# Patient Record
Sex: Male | Born: 1985 | Race: White | Hispanic: No | Marital: Married | Smoking: Never smoker
Health system: Southern US, Community
[De-identification: ages and names within clinical notes are randomized; demographics above are authoritative.]

## PROBLEM LIST (undated history)

## (undated) DIAGNOSIS — F32A Depression, unspecified: Secondary | ICD-10-CM

## (undated) DIAGNOSIS — F419 Anxiety disorder, unspecified: Secondary | ICD-10-CM

## (undated) DIAGNOSIS — J45909 Unspecified asthma, uncomplicated: Secondary | ICD-10-CM

## (undated) DIAGNOSIS — J984 Other disorders of lung: Secondary | ICD-10-CM

## (undated) DIAGNOSIS — T7840XA Allergy, unspecified, initial encounter: Secondary | ICD-10-CM

## (undated) DIAGNOSIS — G43909 Migraine, unspecified, not intractable, without status migrainosus: Secondary | ICD-10-CM

## (undated) DIAGNOSIS — S060X9A Concussion with loss of consciousness of unspecified duration, initial encounter: Secondary | ICD-10-CM

## (undated) DIAGNOSIS — S060XAA Concussion with loss of consciousness status unknown, initial encounter: Secondary | ICD-10-CM

## (undated) DIAGNOSIS — F329 Major depressive disorder, single episode, unspecified: Secondary | ICD-10-CM

## (undated) DIAGNOSIS — R7303 Prediabetes: Secondary | ICD-10-CM

## (undated) HISTORY — DX: Anxiety disorder, unspecified: F41.9

## (undated) HISTORY — DX: Concussion with loss of consciousness of unspecified duration, initial encounter: S06.0X9A

## (undated) HISTORY — PX: ADENOIDECTOMY: SUR15

## (undated) HISTORY — PX: TONSILLECTOMY: SUR1361

## (undated) HISTORY — DX: Concussion with loss of consciousness status unknown, initial encounter: S06.0XAA

## (undated) HISTORY — DX: Allergy, unspecified, initial encounter: T78.40XA

## (undated) HISTORY — DX: Unspecified asthma, uncomplicated: J45.909

## (undated) HISTORY — DX: Other disorders of lung: J98.4

## (undated) HISTORY — DX: Prediabetes: R73.03

## (undated) HISTORY — DX: Migraine, unspecified, not intractable, without status migrainosus: G43.909

## (undated) HISTORY — PX: APPENDECTOMY: SHX54

## (undated) HISTORY — DX: Depression, unspecified: F32.A

---
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
## 2005-07-18 DIAGNOSIS — L259 Unspecified contact dermatitis, unspecified cause: Secondary | ICD-10-CM | POA: Insufficient documentation

## 2006-02-07 DIAGNOSIS — Z0000 Encounter for general adult medical examination without abnormal findings: Secondary | ICD-10-CM | POA: Insufficient documentation

## 2007-02-03 DIAGNOSIS — M542 Cervicalgia: Secondary | ICD-10-CM | POA: Insufficient documentation

## 2008-02-05 DIAGNOSIS — R0683 Snoring: Secondary | ICD-10-CM | POA: Insufficient documentation

## 2015-11-11 ENCOUNTER — Ambulatory Visit (INDEPENDENT_AMBULATORY_CARE_PROVIDER_SITE_OTHER): Payer: BC Managed Care – PPO

## 2015-11-11 ENCOUNTER — Ambulatory Visit (INDEPENDENT_AMBULATORY_CARE_PROVIDER_SITE_OTHER): Payer: BC Managed Care – PPO | Admitting: Family Medicine

## 2015-11-11 VITALS — BP 128/82 | HR 103 | Temp 98.6°F | Resp 18 | Ht 70.0 in | Wt 253.2 lb

## 2015-11-11 DIAGNOSIS — R0602 Shortness of breath: Secondary | ICD-10-CM | POA: Diagnosis not present

## 2015-11-11 DIAGNOSIS — Z131 Encounter for screening for diabetes mellitus: Secondary | ICD-10-CM

## 2015-11-11 DIAGNOSIS — J189 Pneumonia, unspecified organism: Secondary | ICD-10-CM | POA: Diagnosis not present

## 2015-11-11 DIAGNOSIS — R05 Cough: Secondary | ICD-10-CM | POA: Diagnosis not present

## 2015-11-11 DIAGNOSIS — J9801 Acute bronchospasm: Secondary | ICD-10-CM | POA: Diagnosis not present

## 2015-11-11 DIAGNOSIS — J101 Influenza due to other identified influenza virus with other respiratory manifestations: Secondary | ICD-10-CM | POA: Diagnosis not present

## 2015-11-11 LAB — POCT CBC
Granulocyte percent: 71 % (ref 37–80)
HCT, POC: 40.4 % — AB (ref 43.5–53.7)
Hemoglobin: 14.6 g/dL (ref 14.1–18.1)
Lymph, poc: 2.6 (ref 0.6–3.4)
MCH, POC: 29.5 pg (ref 27–31.2)
MCHC: 36.3 g/dL — AB (ref 31.8–35.4)
MCV: 81.2 fL (ref 80–97)
MID (cbc): 0.7 (ref 0–0.9)
MPV: 6 fL (ref 0–99.8)
POC Granulocyte: 8.1 — AB (ref 2–6.9)
POC LYMPH PERCENT: 22.5 % (ref 10–50)
POC MID %: 6.5 % (ref 0–12)
Platelet Count, POC: 325 10*3/uL (ref 142–424)
RBC: 4.97 M/uL (ref 4.69–6.13)
RDW, POC: 12.6 %
WBC: 11.4 10*3/uL — AB (ref 4.6–10.2)

## 2015-11-11 LAB — POCT INFLUENZA A/B
Influenza A, POC: NEGATIVE
Influenza B, POC: NEGATIVE

## 2015-11-11 LAB — GLUCOSE, POCT (MANUAL RESULT ENTRY): POC Glucose: 103 mg/dL — AB (ref 70–99)

## 2015-11-11 MED ORDER — ALBUTEROL SULFATE (2.5 MG/3ML) 0.083% IN NEBU
2.5000 mg | INHALATION_SOLUTION | Freq: Once | RESPIRATORY_TRACT | Status: AC
  Administered 2015-11-11: 2.5 mg via RESPIRATORY_TRACT

## 2015-11-11 MED ORDER — PREDNISONE 20 MG PO TABS: ORAL_TABLET | ORAL | Status: DC

## 2015-11-11 MED ORDER — HYDROCOD POLST-CPM POLST ER 10-8 MG/5ML PO SUER: 5.0000 mL | Freq: Two times a day (BID) | ORAL | Status: DC | PRN

## 2015-11-11 MED ORDER — DOXYCYCLINE HYCLATE 100 MG PO CAPS: 100.0000 mg | ORAL_CAPSULE | Freq: Two times a day (BID) | ORAL | Status: DC

## 2015-11-11 MED ORDER — ALBUTEROL SULFATE HFA 108 (90 BASE) MCG/ACT IN AERS: 2.0000 | INHALATION_SPRAY | Freq: Four times a day (QID) | RESPIRATORY_TRACT | Status: DC | PRN

## 2015-11-11 MED ORDER — IPRATROPIUM BROMIDE 0.02 % IN SOLN
0.5000 mg | Freq: Once | RESPIRATORY_TRACT | Status: AC
  Administered 2015-11-11: 0.5 mg via RESPIRATORY_TRACT

## 2015-11-11 NOTE — Progress Notes (Signed)
Subjective:    Patient ID: Jonathon Murphy, male    DOB: September 22, 1985, 30 y.o.   MRN: [id number]  11/11/2015  Laryngitis; Sore Throat; Cough; and Shortness of Breath   HPI This 30 y.o. male presents for evaluation of persistent flu symptoms.  Prescribed Tamiflu recently for influenza; March 9,2017; completed on 11/08/15.  +possible fever in past 72 hours. Onset of illness on 11/01/15.  +chills/sweats. No body aches in last two days.  +HA.  +ear pain B; +ST moderate ten days ago. +Rhinorrhea green yellow bloody.  +hoarseness one week ago.  +horrible cough; +SOB with flu; improved temporarily and now has worsened; black mold in room at [employer] in dorm room.  +wheezing.  +post-tussive emesis; +diarrhea.  +Tamiflu, Mucinex DM, nasal spray.  Seen at Urgent Care in [place].  No tobacco.  +childhood asthma; +allergies with history of allergy shots.  Did not get flu vaccine this year.   PCP: moved to [place] three months ago; from [place].     Review of Systems  Constitutional: Positive for fever, chills, diaphoresis and fatigue. Negative for activity change and appetite change.  HENT: Positive for congestion, ear pain, postnasal drip, rhinorrhea, sinus pressure, sore throat and voice change. Negative for trouble swallowing.   Eyes: Negative for visual disturbance.  Respiratory: Positive for cough, shortness of breath and wheezing.   Cardiovascular: Negative for chest pain, palpitations and leg swelling.  Gastrointestinal: Positive for diarrhea. Negative for nausea, vomiting and abdominal pain.  Endocrine: Negative for cold intolerance, heat intolerance, polydipsia, polyphagia and polyuria.  Neurological: Positive for headaches. Negative for dizziness, tremors, seizures, syncope, facial asymmetry, speech difficulty, weakness, light-headedness and numbness.    History reviewed. No pertinent past medical history. History reviewed. No pertinent past surgical history. Allergies  Allergen Reactions  . Bee  Venom Anaphylaxis  . Sulfa Antibiotics Shortness Of Breath  . Benadryl [Diphenhydramine] Hives  . Erythromycin Other (See Comments)    Does not know  . Suprax [Cefixime] Other (See Comments)    Does not know      Social History   Social History  . Marital Status: Single    Spouse Name: N/A  . Number of Children: N/A  . Years of Education: N/A   Occupational History  . Not on file.   Social History Main Topics  . Smoking status: Never Smoker   . Smokeless tobacco: Not on file  . Alcohol Use: Not on file  . Drug Use: Not on file  . Sexual Activity: Not on file   Other Topics Concern  . Not on file   Social History Narrative  . No narrative on file   History reviewed. No pertinent family history.     Objective:    BP 128/82 mmHg  Pulse 103  Temp(Src) 98.6 F (37 C) (Oral)  Resp 18  Ht  (1.778 m)  Wt 253 lb 3.2 oz (114.851 kg)  BMI 36.33 kg/m2  SpO2 99% Physical Exam  Constitutional: He is oriented to person, place, and time. He appears well-developed and well-nourished. No distress.  obese  HENT:  Head: Normocephalic and atraumatic.  Right Ear: Tympanic membrane, external ear and ear canal normal.  Left Ear: Tympanic membrane, external ear and ear canal normal.  Nose: Mucosal edema and rhinorrhea present. Right sinus exhibits maxillary sinus tenderness. Right sinus exhibits no frontal sinus tenderness. Left sinus exhibits maxillary sinus tenderness. Left sinus exhibits no frontal sinus tenderness.  Mouth/Throat: Oropharynx is clear and moist. No oropharyngeal exudate.  Eyes: Conjunctivae and EOM are normal. Pupils are equal, round, and reactive to light.  Neck: Normal range of motion. Neck supple. Carotid bruit is not present. No thyromegaly present.  Cardiovascular: Normal rate, regular rhythm, normal heart sounds and intact distal pulses.  Exam reveals no gallop and no friction rub.   No murmur heard. Pulmonary/Chest: Effort normal and breath sounds  normal. No accessory muscle usage. Tachypnea noted. He has no decreased breath sounds. He has no wheezes. He has no rales.  RR 22.  Harsh hacking cough throughout exam.  Musculoskeletal: He exhibits no edema.  Lymphadenopathy:    He has cervical adenopathy.  Neurological: He is alert and oriented to person, place, and time. No cranial nerve deficit.  Skin: Skin is warm and dry. No rash noted. He is not diaphoretic.  Psychiatric: He has a normal mood and affect. His behavior is normal.  Nursing note and vitals reviewed.  Results for orders placed or performed in visit on 11/11/15  POCT CBC  Result Value Ref Range   WBC 11.4 (A) 4.6 - 10.2 K/uL   Lymph, poc 2.6 0.6 - 3.4   POC LYMPH PERCENT 22.5 10 - 50 %L   MID (cbc) 0.7 0 - 0.9   POC MID % 6.5 0 - 12 %M   POC Granulocyte 8.1 (A) 2 - 6.9   Granulocyte percent 71.0 37 - 80 %G   RBC 4.97 4.69 - 6.13 M/uL   Hemoglobin 14.6 14.1 - 18.1 g/dL   HCT, POC 16.1 (A) 09.6 - 53.7 %   MCV 81.2 80 - 97 fL   MCH, POC 29.5 27 - 31.2 pg   MCHC 36.3 (A) 31.8 - 35.4 g/dL   RDW, POC 04.5 %   Platelet Count, POC 325 142 - 424 K/uL   MPV 6.0 0 - 99.8 fL  POCT glucose (manual entry)  Result Value Ref Range   POC Glucose 103 (A) 70 - 99 mg/dl  POCT Influenza A/B  Result Value Ref Range   Influenza A, POC Negative Negative   Influenza B, POC Negative Negative       Assessment & Plan:   1. Influenza A   2. Bronchospasm   3. Screening for diabetes mellitus   4. CAP (community acquired pneumonia)     Orders Placed This Encounter  Procedures  . DG Chest 2 View    Standing Status: Future     Number of Occurrences: 1     Standing Expiration Date: 11/10/2016    Order Specific Question:  Reason for Exam (SYMPTOM  OR DIAGNOSIS REQUIRED)    Answer:  influenza A positive ten days ago; now with SOB, wheezing    Order Specific Question:  Preferred imaging location?    Answer:  External  . POCT CBC  . POCT glucose (manual entry)  . POCT Influenza  A/B   Meds ordered this encounter  Medications  . guaiFENesin (MUCINEX) 600 MG 12 hr tablet    Sig: Take by mouth 2 (two) times daily.  . Pseudoeph-Doxylamine-DM-APAP (NYQUIL PO)    Sig: Take by mouth.  Marland Kitchen albuterol (PROVENTIL) (2.5 MG/3ML) 0.083% nebulizer solution 2.5 mg    Sig:   . ipratropium (ATROVENT) nebulizer solution 0.5 mg    Sig:   . doxycycline (VIBRAMYCIN) 100 MG capsule    Sig: Take 1 capsule (100 mg total) by mouth 2 (two) times daily.    Dispense:  20 capsule    Refill:  0  . predniSONE (DELTASONE)  20 MG tablet    Sig: Three tablets daily x 2 days then two tablets daily x 5 days then one tablet daily x 5 days    Dispense:  21 tablet    Refill:  0  . chlorpheniramine-HYDROcodone (TUSSIONEX PENNKINETIC ER) 10-8 MG/5ML SUER    Sig: Take 5 mLs by mouth every 12 (twelve) hours as needed for cough.    Dispense:  180 mL    Refill:  0  . albuterol (PROVENTIL HFA;VENTOLIN HFA) 108 (90 Base) MCG/ACT inhaler    Sig: Inhale 2 puffs into the lungs every 6 (six) hours as needed for wheezing or shortness of breath (cough, shortness of breath or wheezing.).    Dispense:  1 Inhaler    Refill:  1    No Follow-up on file.    Dequann Vandervelden Paulita FujitaMartin Legend Tumminello, M.D. [facility]  [place] [address] [place], [place]  [id number] [phone] phone [phone] fax

## 2015-11-11 NOTE — Patient Instructions (Addendum)
   IF you received an x-ray today, you will receive an invoice from [facility]. Please contact  Radiology at [phone] with questions or concerns regarding your invoice.   IF you received labwork today, you will receive an invoice from [facility]. Please contact [facility] at [phone] with questions or concerns regarding your invoice.   Our billing staff will not be able to assist you with questions regarding bills from these companies.  You will be contacted with the lab results as soon as they are available. The fastest way to get your results is to activate your My Chart account. Instructions are located on the last page of this paperwork. If you have not heard from us regarding the results in 2 weeks, please contact this office.      Community-Acquired Pneumonia, Adult Pneumonia is an infection of the lungs. There are different types of pneumonia. One type can develop while a person is in a hospital. A different type, called community-acquired pneumonia, develops in people who are not, or have not recently been, in the hospital or other health care facility.  CAUSES Pneumonia may be caused by bacteria, viruses, or funguses. Community-acquired pneumonia is often caused by Streptococcus pneumonia bacteria. These bacteria are often passed from one person to another by breathing in droplets from the cough or sneeze of an infected person. RISK FACTORS The condition is more likely to develop in:  People who havechronic diseases, such as chronic obstructive pulmonary disease (COPD), asthma, congestive heart failure, cystic fibrosis, diabetes, or kidney disease.  People who haveearly-stage or late-stage HIV.  People who havesickle cell disease.  People who havehad their spleen removed (splenectomy).  People who havepoor dental hygiene.  People who havemedical conditions that increase the risk of breathing in (aspirating)  secretions their own mouth and nose.   People who havea weakened immune system (immunocompromised).  People who smoke.  People whotravel to areas where pneumonia-causing germs commonly exist.  People whoare around animal habitats or animals that have pneumonia-causing germs, including birds, bats, rabbits, cats, and farm animals. SYMPTOMS Symptoms of this condition include:  Adry cough.  A wet (productive) cough.  Fever.  Sweating.  Chest pain, especially when breathing deeply or coughing.  Rapid breathing or difficulty breathing.  Shortness of breath.  Shaking chills.  Fatigue.  Muscle aches. DIAGNOSIS Your health care provider will take a medical history and perform a physical exam. You may also have other tests, including:  Imaging studies of your chest, including X-rays.  Tests to check your blood oxygen level and other blood gases.  Other tests on blood, mucus (sputum), fluid around your lungs (pleural fluid), and urine. If your pneumonia is severe, other tests may be done to identify the specific cause of your illness. TREATMENT The type of treatment that you receive depends on many factors, such as the cause of your pneumonia, the medicines you take, and other medical conditions that you have. For most adults, treatment and recovery from pneumonia may occur at home. In some cases, treatment must happen in a hospital. Treatment may include:  Antibiotic medicines, if the pneumonia was caused by bacteria.  Antiviral medicines, if the pneumonia was caused by a virus.  Medicines that are given by mouth or through an IV tube.  Oxygen.  Respiratory therapy. Although rare, treating severe pneumonia may include:  Mechanical ventilation. This is done if you are not breathing well on your own and you cannot maintain a safe blood oxygen level.    Thoracentesis. This procedureremoves fluid around one lung or both lungs to help you breathe better. HOME CARE  INSTRUCTIONS  Take over-the-counter and prescription medicines only as told by your health care provider.  Only takecough medicine if you are losing sleep. Understand that cough medicine can prevent your body's natural ability to remove mucus from your lungs.  If you were prescribed an antibiotic medicine, take it as told by your health care provider. Do not stop taking the antibiotic even if you start to feel better.  Sleep in a semi-upright position at night. Try sleeping in a reclining chair, or place a few pillows under your head.  Do not use tobacco products, including cigarettes, chewing tobacco, and e-cigarettes. If you need help quitting, ask your health care provider.  Drink enough water to keep your urine clear or pale yellow. This will help to thin out mucus secretions in your lungs. PREVENTION There are ways that you can decrease your risk of developing community-acquired pneumonia. Consider getting a pneumococcal vaccine if:  You are older than 30 years of age.  You are older than 30 years of age and are undergoing cancer treatment, have chronic lung disease, or have other medical conditions that affect your immune system. Ask your health care provider if this applies to you. There are different types and schedules of pneumococcal vaccines. Ask your health care provider which vaccination option is best for you. You may also prevent community-acquired pneumonia if you take these actions:  Get an influenza vaccine every year. Ask your health care provider which type of influenza vaccine is best for you.  Go to the dentist on a regular basis.  Wash your hands often. Use hand sanitizer if soap and water are not available. SEEK MEDICAL CARE IF:  You have a fever.  You are losing sleep because you cannot control your cough with cough medicine. SEEK IMMEDIATE MEDICAL CARE IF:  You have worsening shortness of breath.  You have increased chest pain.  Your sickness becomes  worse, especially if you are an older adult or have a weakened immune system.  You cough up blood.   This information is not intended to replace advice given to you by your health care provider. Make sure you discuss any questions you have with your health care provider.   Document Released: 08/13/2005 Document Revised: 05/04/2015 Document Reviewed: 12/08/2014 Elsevier Interactive Patient Education 2016 [employer]  

## 2016-10-19 ENCOUNTER — Other Ambulatory Visit: Payer: Self-pay | Admitting: Family Medicine

## 2016-10-19 DIAGNOSIS — R945 Abnormal results of liver function studies: Principal | ICD-10-CM

## 2016-10-19 DIAGNOSIS — R7989 Other specified abnormal findings of blood chemistry: Secondary | ICD-10-CM

## 2016-10-25 ENCOUNTER — Ambulatory Visit
Admission: RE | Admit: 2016-10-25 | Discharge: 2016-10-25 | Disposition: A | Discharge status: Home / Self Care | Payer: BC Managed Care – PPO | Source: Ambulatory Visit | Attending: Family Medicine | Admitting: Family Medicine

## 2016-10-25 DIAGNOSIS — R7989 Other specified abnormal findings of blood chemistry: Secondary | ICD-10-CM

## 2016-10-25 DIAGNOSIS — R945 Abnormal results of liver function studies: Principal | ICD-10-CM

## 2017-06-05 ENCOUNTER — Emergency Department (HOSPITAL_COMMUNITY): Payer: BC Managed Care – PPO

## 2017-06-05 ENCOUNTER — Encounter (HOSPITAL_COMMUNITY)
Admission: EM | Disposition: A | Discharge status: Home / Self Care | Payer: Self-pay | Source: Home / Self Care | Attending: Emergency Medicine

## 2017-06-05 ENCOUNTER — Observation Stay (HOSPITAL_COMMUNITY): Payer: BC Managed Care – PPO | Admitting: Anesthesiology

## 2017-06-05 ENCOUNTER — Encounter (HOSPITAL_COMMUNITY): Payer: Self-pay | Admitting: Emergency Medicine

## 2017-06-05 ENCOUNTER — Observation Stay (HOSPITAL_COMMUNITY)
Admission: EM | Admit: 2017-06-05 | Discharge: 2017-06-06 | Disposition: A | Discharge status: Home / Self Care | Payer: BC Managed Care – PPO | Attending: General Surgery | Admitting: General Surgery

## 2017-06-05 DIAGNOSIS — Z79899 Other long term (current) drug therapy: Secondary | ICD-10-CM | POA: Insufficient documentation

## 2017-06-05 DIAGNOSIS — K3580 Unspecified acute appendicitis: Secondary | ICD-10-CM | POA: Diagnosis present

## 2017-06-05 DIAGNOSIS — Z91040 Latex allergy status: Secondary | ICD-10-CM | POA: Insufficient documentation

## 2017-06-05 DIAGNOSIS — J45909 Unspecified asthma, uncomplicated: Secondary | ICD-10-CM | POA: Diagnosis not present

## 2017-06-05 DIAGNOSIS — K3589 Other acute appendicitis without perforation or gangrene: Secondary | ICD-10-CM

## 2017-06-05 DIAGNOSIS — K353 Acute appendicitis with localized peritonitis, without perforation or gangrene: Principal | ICD-10-CM | POA: Insufficient documentation

## 2017-06-05 DIAGNOSIS — Z881 Allergy status to other antibiotic agents status: Secondary | ICD-10-CM | POA: Diagnosis not present

## 2017-06-05 HISTORY — PX: LAPAROSCOPIC APPENDECTOMY: SHX408

## 2017-06-05 LAB — COMPREHENSIVE METABOLIC PANEL
ALBUMIN: 4.4 g/dL (ref 3.5–5.0)
ALT: 100 U/L — ABNORMAL HIGH (ref 17–63)
ANION GAP: 10 (ref 5–15)
AST: 48 U/L — ABNORMAL HIGH (ref 15–41)
Alkaline Phosphatase: 64 U/L (ref 38–126)
BUN: 12 mg/dL (ref 6–20)
CHLORIDE: 105 mmol/L (ref 101–111)
CO2: 24 mmol/L (ref 22–32)
Calcium: 9 mg/dL (ref 8.9–10.3)
Creatinine, Ser: 0.94 mg/dL (ref 0.61–1.24)
GFR calc non Af Amer: >60 mL/min (ref >60)
GLUCOSE: 136 mg/dL — AB (ref 65–99)
Potassium: 3.7 mmol/L (ref 3.5–5.1)
SODIUM: 139 mmol/L (ref 135–145)
Total Bilirubin: 0.9 mg/dL (ref 0.3–1.2)
Total Protein: 7.1 g/dL (ref 6.5–8.1)

## 2017-06-05 LAB — URINALYSIS, ROUTINE W REFLEX MICROSCOPIC
Bilirubin Urine: NEGATIVE
Glucose, UA: NEGATIVE mg/dL
Hgb urine dipstick: NEGATIVE
Ketones, ur: NEGATIVE mg/dL
LEUKOCYTES UA: NEGATIVE
NITRITE: NEGATIVE
PH: 6 (ref 5.0–8.0)
Protein, ur: NEGATIVE mg/dL
Specific Gravity, Urine: >1.046 — ABNORMAL HIGH (ref 1.005–1.030)

## 2017-06-05 LAB — CBC WITH DIFFERENTIAL/PLATELET
BASOS PCT: 0 %
Basophils Absolute: 0 10*3/uL (ref 0.0–0.1)
EOS ABS: 0 10*3/uL (ref 0.0–0.7)
EOS PCT: 0 %
HCT: 39.8 % (ref 39.0–52.0)
HEMOGLOBIN: 14.1 g/dL (ref 13.0–17.0)
LYMPHS PCT: 16 %
Lymphs Abs: 2.1 10*3/uL (ref 0.7–4.0)
MCH: 28.9 pg (ref 26.0–34.0)
MCHC: 35.4 g/dL (ref 30.0–36.0)
MCV: 81.6 fL (ref 78.0–100.0)
MONOS PCT: 6 %
Monocytes Absolute: 0.8 10*3/uL (ref 0.1–1.0)
NEUTROS ABS: 10.4 10*3/uL — AB (ref 1.7–7.7)
NEUTROS PCT: 78 %
PLATELETS: 198 10*3/uL (ref 150–400)
RBC: 4.88 MIL/uL (ref 4.22–5.81)
RDW: 13 % (ref 11.5–15.5)
WBC: 13.3 10*3/uL — AB (ref 4.0–10.5)

## 2017-06-05 LAB — SURGICAL PCR SCREEN
MRSA, PCR: NEGATIVE
STAPHYLOCOCCUS AUREUS: POSITIVE — AB

## 2017-06-05 LAB — LIPASE, BLOOD: Lipase: 24 U/L (ref 11–51)

## 2017-06-05 SURGERY — APPENDECTOMY, LAPAROSCOPIC
Anesthesia: General

## 2017-06-05 MED ORDER — ACETAMINOPHEN 500 MG PO TABS
1000.0000 mg | ORAL_TABLET | Freq: Four times a day (QID) | ORAL | Status: DC
  Administered 2017-06-05 – 2017-06-06 (×4): 1000 mg via ORAL
  Filled 2017-06-05 (×4): qty 2

## 2017-06-05 MED ORDER — FENTANYL CITRATE (PF) 250 MCG/5ML IJ SOLN
INTRAMUSCULAR | Status: AC
  Filled 2017-06-05: qty 5

## 2017-06-05 MED ORDER — SUCCINYLCHOLINE CHLORIDE 20 MG/ML IJ SOLN
INTRAMUSCULAR | Status: DC | PRN
  Administered 2017-06-05: 140 mg via INTRAVENOUS

## 2017-06-05 MED ORDER — 0.9 % SODIUM CHLORIDE (POUR BTL) OPTIME
TOPICAL | Status: DC | PRN
  Administered 2017-06-05: 1000 mL

## 2017-06-05 MED ORDER — SODIUM CHLORIDE 0.9 % IV BOLUS (SEPSIS)
1000.0000 mL | Freq: Once | INTRAVENOUS | Status: AC
  Administered 2017-06-05: 1000 mL via INTRAVENOUS

## 2017-06-05 MED ORDER — ONDANSETRON HCL 4 MG/2ML IJ SOLN: 4.0000 mg | Freq: Four times a day (QID) | INTRAMUSCULAR | Status: DC | PRN

## 2017-06-05 MED ORDER — LACTATED RINGERS IR SOLN
Status: DC | PRN
  Administered 2017-06-05: 1000 mL

## 2017-06-05 MED ORDER — ACETAMINOPHEN 500 MG PO TABS: 1000.0000 mg | ORAL_TABLET | Freq: Once | ORAL | Status: DC

## 2017-06-05 MED ORDER — GABAPENTIN 300 MG PO CAPS
300.0000 mg | ORAL_CAPSULE | Freq: Two times a day (BID) | ORAL | Status: DC
  Administered 2017-06-05 – 2017-06-06 (×2): 300 mg via ORAL
  Filled 2017-06-05 (×2): qty 1

## 2017-06-05 MED ORDER — MORPHINE SULFATE (PF) 4 MG/ML IV SOLN
4.0000 mg | Freq: Once | INTRAVENOUS | Status: AC
  Administered 2017-06-05: 4 mg via INTRAVENOUS
  Filled 2017-06-05: qty 1

## 2017-06-05 MED ORDER — KETOROLAC TROMETHAMINE 30 MG/ML IJ SOLN
INTRAMUSCULAR | Status: AC
  Filled 2017-06-05: qty 1

## 2017-06-05 MED ORDER — ROCURONIUM BROMIDE 100 MG/10ML IV SOLN
INTRAVENOUS | Status: DC | PRN
  Administered 2017-06-05: 30 mg via INTRAVENOUS

## 2017-06-05 MED ORDER — ALBUTEROL SULFATE (2.5 MG/3ML) 0.083% IN NEBU: 3.0000 mL | INHALATION_SOLUTION | Freq: Four times a day (QID) | RESPIRATORY_TRACT | Status: DC | PRN

## 2017-06-05 MED ORDER — ROCURONIUM BROMIDE 50 MG/5ML IV SOSY
PREFILLED_SYRINGE | INTRAVENOUS | Status: AC
  Filled 2017-06-05: qty 5

## 2017-06-05 MED ORDER — DEXAMETHASONE SODIUM PHOSPHATE 10 MG/ML IJ SOLN
INTRAMUSCULAR | Status: AC
  Filled 2017-06-05: qty 1

## 2017-06-05 MED ORDER — IOPAMIDOL (ISOVUE-300) INJECTION 61%
100.0000 mL | Freq: Once | INTRAVENOUS | Status: AC | PRN
  Administered 2017-06-05: 100 mL via INTRAVENOUS

## 2017-06-05 MED ORDER — MIDAZOLAM HCL 5 MG/5ML IJ SOLN
INTRAMUSCULAR | Status: DC | PRN
  Administered 2017-06-05: 2 mg via INTRAVENOUS

## 2017-06-05 MED ORDER — ZOLPIDEM TARTRATE 5 MG PO TABS: 5.0000 mg | ORAL_TABLET | Freq: Every evening | ORAL | Status: DC | PRN

## 2017-06-05 MED ORDER — PIPERACILLIN-TAZOBACTAM 3.375 G IVPB 30 MIN
3.3750 g | Freq: Once | INTRAVENOUS | Status: AC
  Administered 2017-06-05: 3.375 g via INTRAVENOUS
  Filled 2017-06-05: qty 50

## 2017-06-05 MED ORDER — MORPHINE SULFATE (PF) 2 MG/ML IV SOLN
1.0000 mg | INTRAVENOUS | Status: DC | PRN
  Administered 2017-06-05: 2 mg via INTRAVENOUS
  Filled 2017-06-05: qty 1

## 2017-06-05 MED ORDER — SUGAMMADEX SODIUM 500 MG/5ML IV SOLN
INTRAVENOUS | Status: AC
  Filled 2017-06-05: qty 5

## 2017-06-05 MED ORDER — SCOPOLAMINE 1 MG/3DAYS TD PT72
1.0000 | MEDICATED_PATCH | Freq: Once | TRANSDERMAL | Status: DC
  Administered 2017-06-05: 1.5 mg via TRANSDERMAL

## 2017-06-05 MED ORDER — LIDOCAINE HCL (CARDIAC) 20 MG/ML IV SOLN
INTRAVENOUS | Status: DC | PRN
  Administered 2017-06-05: 100 mg via INTRAVENOUS

## 2017-06-05 MED ORDER — GABAPENTIN 300 MG PO CAPS: 300.0000 mg | ORAL_CAPSULE | Freq: Once | ORAL | Status: DC

## 2017-06-05 MED ORDER — IOPAMIDOL (ISOVUE-300) INJECTION 61%
INTRAVENOUS | Status: AC
  Administered 2017-06-05: 100 mL via INTRAVENOUS
  Filled 2017-06-05: qty 100

## 2017-06-05 MED ORDER — PROMETHAZINE HCL 25 MG/ML IJ SOLN: 12.5000 mg | Freq: Four times a day (QID) | INTRAMUSCULAR | Status: DC | PRN

## 2017-06-05 MED ORDER — SUGAMMADEX SODIUM 500 MG/5ML IV SOLN
INTRAVENOUS | Status: DC | PRN
  Administered 2017-06-05: 300 mg via INTRAVENOUS

## 2017-06-05 MED ORDER — MIDAZOLAM HCL 2 MG/2ML IJ SOLN
INTRAMUSCULAR | Status: AC
  Filled 2017-06-05: qty 2

## 2017-06-05 MED ORDER — SODIUM CHLORIDE 0.9 % IV SOLN
INTRAVENOUS | Status: DC
  Administered 2017-06-05: 07:00:00 via INTRAVENOUS

## 2017-06-05 MED ORDER — MORPHINE SULFATE (PF) 2 MG/ML IV SOLN: 1.0000 mg | INTRAVENOUS | Status: DC | PRN

## 2017-06-05 MED ORDER — KETOROLAC TROMETHAMINE 30 MG/ML IJ SOLN
30.0000 mg | Freq: Three times a day (TID) | INTRAMUSCULAR | Status: DC
  Administered 2017-06-05 – 2017-06-06 (×3): 30 mg via INTRAVENOUS
  Filled 2017-06-05 (×2): qty 1

## 2017-06-05 MED ORDER — PIPERACILLIN-TAZOBACTAM 3.375 G IVPB
3.3750 g | Freq: Three times a day (TID) | INTRAVENOUS | Status: DC
  Administered 2017-06-05 – 2017-06-06 (×3): 3.375 g via INTRAVENOUS
  Filled 2017-06-05 (×4): qty 50

## 2017-06-05 MED ORDER — BUPIVACAINE-EPINEPHRINE (PF) 0.25% -1:200000 IJ SOLN
INTRAMUSCULAR | Status: AC
  Filled 2017-06-05: qty 30

## 2017-06-05 MED ORDER — FENTANYL CITRATE (PF) 100 MCG/2ML IJ SOLN
INTRAMUSCULAR | Status: DC | PRN
  Administered 2017-06-05 (×2): 50 ug via INTRAVENOUS

## 2017-06-05 MED ORDER — ONDANSETRON 4 MG PO TBDP: 4.0000 mg | ORAL_TABLET | Freq: Four times a day (QID) | ORAL | Status: DC | PRN

## 2017-06-05 MED ORDER — ENOXAPARIN SODIUM 40 MG/0.4ML ~~LOC~~ SOLN
40.0000 mg | SUBCUTANEOUS | Status: DC
  Administered 2017-06-06: 40 mg via SUBCUTANEOUS
  Filled 2017-06-05: qty 0.4

## 2017-06-05 MED ORDER — DEXAMETHASONE SODIUM PHOSPHATE 10 MG/ML IJ SOLN
INTRAMUSCULAR | Status: DC | PRN
  Administered 2017-06-05: 10 mg via INTRAVENOUS

## 2017-06-05 MED ORDER — LACTATED RINGERS IV SOLN
INTRAVENOUS | Status: DC
  Administered 2017-06-05 (×2): via INTRAVENOUS

## 2017-06-05 MED ORDER — OXYCODONE HCL 5 MG PO TABS
5.0000 mg | ORAL_TABLET | ORAL | Status: DC | PRN
  Administered 2017-06-06: 5 mg via ORAL
  Filled 2017-06-05: qty 1

## 2017-06-05 MED ORDER — KETOROLAC TROMETHAMINE 30 MG/ML IJ SOLN
INTRAMUSCULAR | Status: DC | PRN
  Administered 2017-06-05: 30 mg via INTRAVENOUS

## 2017-06-05 MED ORDER — SCOPOLAMINE 1 MG/3DAYS TD PT72
MEDICATED_PATCH | TRANSDERMAL | Status: AC
  Filled 2017-06-05: qty 1

## 2017-06-05 MED ORDER — BUPIVACAINE-EPINEPHRINE 0.25% -1:200000 IJ SOLN
INTRAMUSCULAR | Status: DC | PRN
  Administered 2017-06-05: 30 mL

## 2017-06-05 MED ORDER — ONDANSETRON HCL 4 MG/2ML IJ SOLN
INTRAMUSCULAR | Status: DC | PRN
  Administered 2017-06-05: 4 mg via INTRAVENOUS

## 2017-06-05 MED ORDER — ONDANSETRON HCL 4 MG/2ML IJ SOLN
INTRAMUSCULAR | Status: AC
  Filled 2017-06-05: qty 2

## 2017-06-05 MED ORDER — PROPOFOL 10 MG/ML IV BOLUS
INTRAVENOUS | Status: AC
  Filled 2017-06-05: qty 20

## 2017-06-05 MED ORDER — SUCCINYLCHOLINE CHLORIDE 200 MG/10ML IV SOSY
PREFILLED_SYRINGE | INTRAVENOUS | Status: AC
  Filled 2017-06-05: qty 10

## 2017-06-05 MED ORDER — ONDANSETRON HCL 4 MG/2ML IJ SOLN
4.0000 mg | Freq: Once | INTRAMUSCULAR | Status: AC
  Administered 2017-06-05: 4 mg via INTRAVENOUS
  Filled 2017-06-05: qty 2

## 2017-06-05 MED ORDER — LIDOCAINE 2% (20 MG/ML) 5 ML SYRINGE
INTRAMUSCULAR | Status: AC
  Filled 2017-06-05: qty 5

## 2017-06-05 MED ORDER — PROPOFOL 10 MG/ML IV BOLUS
INTRAVENOUS | Status: DC | PRN
  Administered 2017-06-05: 200 mg via INTRAVENOUS

## 2017-06-05 MED ORDER — PANTOPRAZOLE SODIUM 40 MG IV SOLR
40.0000 mg | Freq: Every day | INTRAVENOUS | Status: DC
Start: 2017-06-05 — End: 2017-06-06
  Administered 2017-06-05: 40 mg via INTRAVENOUS
  Filled 2017-06-05: qty 40

## 2017-06-05 MED ORDER — POTASSIUM CHLORIDE IN NACL 20-0.9 MEQ/L-% IV SOLN
INTRAVENOUS | Status: DC
  Administered 2017-06-05 – 2017-06-06 (×3): via INTRAVENOUS
  Filled 2017-06-05 (×4): qty 1000

## 2017-06-05 MED ORDER — DOCUSATE SODIUM 100 MG PO CAPS
100.0000 mg | ORAL_CAPSULE | Freq: Two times a day (BID) | ORAL | Status: DC
  Administered 2017-06-05 – 2017-06-06 (×2): 100 mg via ORAL
  Filled 2017-06-05 (×2): qty 1

## 2017-06-05 SURGICAL SUPPLY — 40 items
APPLIER CLIP 5 13 M/L LIGAMAX5 (MISCELLANEOUS)
APPLIER CLIP ROT 10 11.4 M/L (STAPLE)
CABLE HIGH FREQUENCY MONO STRZ (ELECTRODE) IMPLANT
CLIP APPLIE 5 13 M/L LIGAMAX5 (MISCELLANEOUS) IMPLANT
CLIP APPLIE ROT 10 11.4 M/L (STAPLE) IMPLANT
CLOSURE WOUND 1/2 X4 (GAUZE/BANDAGES/DRESSINGS)
COVER SURGICAL LIGHT HANDLE (MISCELLANEOUS) ×3 IMPLANT
CUTTER FLEX LINEAR 45M (STAPLE) IMPLANT
DECANTER SPIKE VIAL GLASS SM (MISCELLANEOUS) ×3 IMPLANT
DERMABOND ADVANCED (GAUZE/BANDAGES/DRESSINGS) ×2
DERMABOND ADVANCED .7 DNX12 (GAUZE/BANDAGES/DRESSINGS) ×1 IMPLANT
DEVICE PMI PUNCTURE CLOSURE (MISCELLANEOUS) ×3 IMPLANT
DRAPE LAPAROSCOPIC ABDOMINAL (DRAPES) ×3 IMPLANT
ELECT PENCIL ROCKER SW 15FT (MISCELLANEOUS) IMPLANT
ELECT REM PT RETURN 15FT ADLT (MISCELLANEOUS) ×3 IMPLANT
GLOVE BIO SURGEON STRL SZ7.5 (GLOVE) ×3 IMPLANT
GLOVE INDICATOR 8.0 STRL GRN (GLOVE) ×3 IMPLANT
GOWN STRL REUS W/TWL LRG LVL3 (GOWN DISPOSABLE) ×3 IMPLANT
GOWN STRL REUS W/TWL XL LVL3 (GOWN DISPOSABLE) ×6 IMPLANT
GRASPER SUT TROCAR 14GX15 (MISCELLANEOUS) IMPLANT
KIT BASIN OR (CUSTOM PROCEDURE TRAY) ×3 IMPLANT
L-HOOK LAP DISP 36CM (ELECTROSURGICAL)
LHOOK LAP DISP 36CM (ELECTROSURGICAL) IMPLANT
POUCH RETRIEVAL ECOSAC 10 (ENDOMECHANICALS) ×1 IMPLANT
POUCH RETRIEVAL ECOSAC 10MM (ENDOMECHANICALS) ×2
RELOAD 45 VASCULAR/THIN (ENDOMECHANICALS) ×3 IMPLANT
RELOAD STAPLE TA45 3.5 REG BLU (ENDOMECHANICALS) IMPLANT
SET IRRIG TUBING LAPAROSCOPIC (IRRIGATION / IRRIGATOR) ×3 IMPLANT
SHEARS HARMONIC ACE PLUS 36CM (ENDOMECHANICALS) ×3 IMPLANT
SLEEVE XCEL OPT CAN 5 100 (ENDOMECHANICALS) ×3 IMPLANT
STRIP CLOSURE SKIN 1/2X4 (GAUZE/BANDAGES/DRESSINGS) IMPLANT
SUT MNCRL AB 4-0 PS2 18 (SUTURE) ×3 IMPLANT
SUT VICRYL 0 TIES 12 18 (SUTURE) ×3 IMPLANT
TOWEL OR 17X26 10 PK STRL BLUE (TOWEL DISPOSABLE) ×3 IMPLANT
TRAY FOLEY CATH SILVER 16FR LF (SET/KITS/TRAYS/PACK) ×3 IMPLANT
TRAY LAPAROSCOPIC (CUSTOM PROCEDURE TRAY) ×3 IMPLANT
TROCAR BLADELESS OPT 5 100 (ENDOMECHANICALS) ×3 IMPLANT
TROCAR XCEL BLUNT TIP 100MML (ENDOMECHANICALS) ×3 IMPLANT
TROCAR XCEL NON-BLD 5MMX100MML (ENDOMECHANICALS) ×6 IMPLANT
TUBING INSUF HEATED (TUBING) ×3 IMPLANT

## 2017-06-05 NOTE — ED Triage Notes (Signed)
Pt reports that around 8 pm yesterday began having RLQ abd pain and generalize cramping of abd after eating fettucine and cheese initially thought is was food poisoning .

## 2017-06-05 NOTE — Anesthesia Preprocedure Evaluation (Addendum)
Anesthesia Evaluation  Patient identified by MRN, date of birth, ID band Patient awake    Reviewed: Allergy & Precautions, NPO status , Patient's Chart, lab work & pertinent test results  Airway Mallampati: II  TM Distance: >3 FB Neck ROM: Full    Dental no notable dental hx.    Pulmonary neg pulmonary ROS,    Pulmonary exam normal breath sounds clear to auscultation       Cardiovascular negative cardio ROS Normal cardiovascular exam Rhythm:Regular Rate:Normal     Neuro/Psych negative neurological ROS  negative psych ROS   GI/Hepatic negative GI ROS, Neg liver ROS,   Endo/Other  negative endocrine ROS  Renal/GU negative Renal ROS  negative genitourinary   Musculoskeletal negative musculoskeletal ROS (+)   Abdominal   Peds negative pediatric ROS (+)  Hematology negative hematology ROS (+)   Anesthesia Other Findings   Reproductive/Obstetrics negative OB ROS                             Anesthesia Physical Anesthesia Plan  ASA: II  Anesthesia Plan: General   Post-op Pain Management:    Induction: Intravenous  PONV Risk Score and Plan: 3 and Ondansetron and Midazolam  Airway Management Planned: Oral ETT  Additional Equipment:   Intra-op Plan:   Post-operative Plan: Extubation in OR  Informed Consent: I have reviewed the patients History and Physical, chart, labs and discussed the procedure including the risks, benefits and alternatives for the proposed anesthesia with the patient or authorized representative who has indicated his/her understanding and acceptance.   Dental advisory given  Plan Discussed with: CRNA  Anesthesia Plan Comments:         Anesthesia Quick Evaluation

## 2017-06-05 NOTE — Anesthesia Postprocedure Evaluation (Signed)
Anesthesia Post Note  Patient: Jonathon Murphy  Procedure(s) Performed: APPENDECTOMY LAPAROSCOPIC (N/A )     Patient location during evaluation: PACU Anesthesia Type: General Level of consciousness: awake and alert Pain management: pain level controlled Vital Signs Assessment: post-procedure vital signs reviewed and stable Respiratory status: spontaneous breathing, nonlabored ventilation, respiratory function stable and patient connected to nasal cannula oxygen Cardiovascular status: blood pressure returned to baseline and stable Postop Assessment: no apparent nausea or vomiting Anesthetic complications: no    Last Vitals:  Vitals:   06/05/17 1600 06/05/17 1717  BP: 123/60 117/68  Pulse: 76 79  Resp: 16 16  Temp: 36.8 C 36.8 C  SpO2: 100% 100%    Last Pain:  Vitals:   06/05/17 1800  TempSrc:   PainSc: 5                  Phillips Grout

## 2017-06-05 NOTE — Op Note (Signed)
Jonathon Murphy [id number] 1986/08/27 06/05/2017  Appendectomy, Lap, Procedure Note  Indications: The patient presented with a history of right-sided abdominal pain. A CT revealed findings consistent with acute appendicitis.  Pre-operative Diagnosis: acute appendicitis  Post-operative Diagnosis: suppurative appendicitis  Surgeon: Atilano Ina   Assistants: none  Anesthesia: General endotracheal anesthesia   Procedure Details  The patient was seen again in the Holding Room. The risks, benefits, complications, treatment options, and expected outcomes were discussed with the patient and/or family. The possibilities of perforation of viscus, bleeding, recurrent infection, the need for additional procedures, failure to diagnose a condition, and creating a complication requiring transfusion or operation were discussed. There was concurrence with the proposed plan and informed consent was obtained. The site of surgery was properly noted. The patient was taken to Operating Room, identified as Jonathon Murphy and the procedure verified as Appendectomy. A Time Out was held and the above information confirmed.  The patient was placed in the supine position and general anesthesia was induced, along with placement of orogastric tube, SCDs, and a Foley catheter. The abdomen was prepped and draped in a sterile fashion. A 1.5 centimeter infraumbilical incision was made.  The umbilical stalk was elevated, and the midline fascia was incised with a #11 blade.  A Kelly clamp was used to confirm entrance into the peritoneal cavity.  A pursestring suture was passed around the incision with a 0 Vicryl.  A 12mm Hasson was introduced into the abdomen and the tails of the suture were used to hold the Hasson in place.   The pneumoperitoneum was then established to steady pressure of 15 mmHg.  Additional 5 mm cannulas then placed in the left lower quadrant of the abdomen and the suprapubic region under direct  visualization. A careful evaluation of the entire abdomen was carried out. The patient was placed in Trendelenburg and left lateral decubitus position. The small intestines were retracted in the cephalad and left lateral direction away from the pelvis and right lower quadrant. The patient was found to have an inflammed appendix that was extending into the pelvis. There was no evidence of perforation. However there was a small amount of purulent fluid in the pelvis  The appendix was carefully dissected. The appendix was was skeletonized with the harmonic scalpel.   The appendix was divided at its base using an endo-GIA stapler with a white load. No appendiceal stump was left in place. The appendix was removed from the abdomen with an Ecco bag through the umbilical port.  There was no evidence of bleeding, leakage, or complication after division of the appendix. Irrigation was also performed and irrigate suctioned from the abdomen as well.  The umbilical port site was closed with the purse string suture. The closure was viewed laparoscopically. There was no residual palpable fascial defect.  However given his obesity I did elect to place an additional 0 Vicryl at the umbilical fascia using the PMI suture passer.The trocar site skin wounds were closed with 4-0 Monocryl. Dermabond was applied to the skin incisions.  Instrument, sponge, and needle counts were correct at the conclusion of the case.   Findings: The appendix was found to be inflamed. There were not signs of necrosis.  There was not perforation. There was not abscess formation. There was some purulent fluid in the pelvix  Estimated Blood Loss:  Minimal         Drains: none         Specimens: appendix  Complications:  None; patient tolerated the procedure well.         Disposition: PACU - hemodynamically stable.         Condition: stable  Mary Sella. Andrey Campanile, MD, FACS General, Bariatric, & Minimally Invasive Surgery [facility]  [facility], [place]

## 2017-06-05 NOTE — ED Notes (Signed)
Patient returned from CT

## 2017-06-05 NOTE — ED Provider Notes (Signed)
TIME SEEN: 4:03 AM  CHIEF COMPLAINT: RLQ pain  HPI: Patient is a 31 year old male with history of IBS, asthma, seasonal allergies who presents to the emergency department with right lower quadrant pain. He states pain started out as a cramping diffuse pain around 7:30 PM last night. States he had nausea and vomiting. He has also had diarrhea. Pain radiated into the right lower quadrant became more severe. He has had subjective fevers and chills. No history of abdominal surgeries. He is concerned for appendicitis. No dysuria, hematuria. No bloody stool or melena.  ROS: See HPI Constitutional: no fever  Eyes: no drainage  ENT: no runny nose   Cardiovascular:  no chest pain  Resp: no SOB  GI: no vomiting GU: no dysuria Integumentary: no rash  Allergy: no hives  Musculoskeletal: no leg swelling  Neurological: no slurred speech ROS otherwise negative  PAST MEDICAL HISTORY/PAST SURGICAL HISTORY:  No past medical history on file.  MEDICATIONS:  Prior to Admission medications   Medication Sig Start Date End Date Taking? Authorizing Provider  albuterol (PROVENTIL HFA;VENTOLIN HFA) 108 (90 Base) MCG/ACT inhaler Inhale 2 puffs into the lungs every 6 (six) hours as needed for wheezing or shortness of breath (cough, shortness of breath or wheezing.). 11/11/15   Ethelda Chick, MD  chlorpheniramine-HYDROcodone [facility] PENNKINETIC ER) 10-8 MG/5ML SUER Take 5 mLs by mouth every 12 (twelve) hours as needed for cough. 11/11/15   Ethelda Chick, MD  doxycycline (VIBRAMYCIN) 100 MG capsule Take 1 capsule (100 mg total) by mouth 2 (two) times daily. 11/11/15   Ethelda Chick, MD  guaiFENesin (MUCINEX) 600 MG 12 hr tablet Take by mouth 2 (two) times daily.    [provider]  predniSONE (DELTASONE) 20 MG tablet Three tablets daily x 2 days then two tablets daily x 5 days then one tablet daily x 5 days 11/11/15   Ethelda Chick, MD  Pseudoeph-Doxylamine-DM-APAP (NYQUIL PO) Take by mouth.     [provider]    ALLERGIES:  Allergies  Allergen Reactions  . Bee Venom Anaphylaxis  . Sulfa Antibiotics Shortness Of Breath  . Benadryl [Diphenhydramine] Hives  . Erythromycin Other (See Comments)    Does not know  . Suprax [Cefixime] Other (See Comments)    Does not know      SOCIAL HISTORY:  Social History  Substance Use Topics  . Smoking status: Never Smoker  . Smokeless tobacco: Not on file  . Alcohol use Not on file    FAMILY HISTORY: No family history on file.  EXAM: BP (!) 141/78 (BP Location: Right Arm)   Pulse 89   Temp 98.7 F (37.1 C) (Oral)   Resp 18   Ht  (1.778 m)   Wt 113.4 kg (250 lb)   SpO2 98%   BMI 35.87 kg/m  CONSTITUTIONAL: Alert and oriented and responds appropriately to questions. Well-appearing; well-nourished, obese, afebrile, nontoxic HEAD: Normocephalic EYES: Conjunctivae clear, pupils appear equal, EOMI ENT: normal nose; moist mucous membranes NECK: Supple, no meningismus, no nuchal rigidity, no LAD  CARD: RRR; S1 and S2 appreciated; no murmurs, no clicks, no rubs, no gallops RESP: Normal chest excursion without splinting or tachypnea; breath sounds clear and equal bilaterally; no wheezes, no rhonchi, no rales, no hypoxia or respiratory distress, speaking full sentences ABD/GI: Normal bowel sounds; non-distended; soft, tender to palpation in the right lower quadrant at McBurney's point negative Murphy sign, no rebound, no guarding, no peritoneal signs, no hepatosplenomegaly BACK:  The back  appears normal and is non-tender to palpation, there is no CVA tenderness EXT: Normal ROM in all joints; non-tender to palpation; no edema; normal capillary refill; no cyanosis, no calf tenderness or swelling    SKIN: Normal color for age and race; warm; no rash NEURO: Moves all extremities equally PSYCH: The patient's mood and manner are appropriate. Grooming and personal hygiene are appropriate.  MEDICAL DECISION MAKING: Pt here  with right lower quarter pain. Concern for possible appendicitis. Differential also includes bowel obstruction, colitis, diverticulitis. Less likely cholecystitis. We'll obtain labs, urine and CT of his abdomen and pelvis.  ED PROGRESS: Labs show leukocytosis of 13,000 with left shift. He does have mild elevation of his AST and ALT but no right upper quadrant tenderness on exam. Otherwise LFTs and lipase normal. CT scan shows mild appendicitis without perforation or abscess. There are nonspecific hypodensities within the liver. Pancreas and gallbladder appear normal. We'll discuss with general surgery.  6:29 AM  D/w Dr. Gerrit Friends with general surgery. They will see patient in the morning today for admission and take him to the operating room.  Pt is NPO.  Will continue IVF.  Pain has been well-controlled with one dose of pain medication. Will give patient dose of IV Zosyn as he is allergic to cephalosporins.  I reviewed all nursing notes, vitals, pertinent previous records, EKGs, lab and urine results, imaging (as available).    Binnie Droessler, Layla Maw, DO 06/05/17 [phone]

## 2017-06-05 NOTE — H&P (Signed)
Jonathon Murphy is an 31 y.o. male.   PCP:   Fanny Bien, MD  Chief Complaint:  RLQ pain  HPI: Pt with RLQ pain and abdominal pain and generalized cramping last PM around 7:30 PM, it got worse over the evening and moved to his RLQ.  He tried to vomit initially thinking he ate something bad and could not, later he developed nausea and vomiting.  He got settled down and was able to sleep some but by 1 AM pain was worse and in the RLQ.  He came to the ED this AM.  Still having RLQ pain and nausea.  No further vomiting.  Work up in the ED, he is afebrile, VSS.  CMP up some, CBC is 3.3, H/H 14/39.  CT scan shows: The appendix is dilated to 1.1 cm in diameter, with surrounding soft tissue inflammation, concerning for mild appendicitis. There is no evidence of perforation or abscess formation at this time. No evidence of perforation or abscess. Additional findings 7 mm hypodense area in liver. 2.7 cm pericardial or epcaridal cyst right side of mediastinum.  We are ask to see  History reviewed. No pertinent past medical history.  Hx of premature birth - intermittent IBD Remote hx of childhood asthma  History reviewed. No pertinent surgical history.  History reviewed. No pertinent family history. Social History:  reports that he has never smoked. He has never used smokeless tobacco. He reports that he does not drink alcohol or use drugs.   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 Bee Venom Anaphylaxis  . Sulfa Antibiotics Shortness Of Breath  . Benadryl [Diphenhydramine] Hives  . Erythromycin Other (See Comments)    Does not know  . Other Itching and Swelling    Anything in the "Nut family"  . Suprax [Cefixime] Other (See Comments)    Does not know    . Latex Rash  . Tape Rash    Paper tape is ok    Prior to Admission medications   Medication Sig Start Date End Date Taking?  Authorizing Provider  albuterol (PROVENTIL HFA;VENTOLIN HFA) 108 (90 Base) MCG/ACT inhaler Inhale 2 puffs into the lungs every 6 (six) hours as needed for wheezing or shortness of breath (cough, shortness of breath or wheezing.). 11/11/15  Yes Wardell Honour, MD     Results for orders placed or performed during the hospital encounter of 06/05/17 (from the past 48 hour(s))  CBC with Differential     Status: Abnormal   Collection Time: 06/05/17  4:04 AM  Result Value Ref Range   WBC 13.3 (H) 4.0 - 10.5 K/uL   RBC 4.88 4.22 - 5.81 MIL/uL   Hemoglobin 14.1 13.0 - 17.0 g/dL   HCT 39.8 39.0 - 52.0 %   MCV 81.6 78.0 - 100.0 fL   MCH 28.9 26.0 - 34.0 pg   MCHC 35.4 30.0 - 36.0 g/dL   RDW 13.0 11.5 - 15.5 %   Platelets 198 150 - 400 K/uL   Neutrophils Relative % 78 %   Neutro Abs 10.4 (H) 1.7 - 7.7 K/uL   Lymphocytes Relative 16 %   Lymphs Abs 2.1 0.7 - 4.0 K/uL   Monocytes Relative 6 %   Monocytes Absolute 0.8 0.1 - 1.0 K/uL   Eosinophils Relative 0 %   Eosinophils Absolute 0.0 0.0 - 0.7 K/uL   Basophils Relative 0 %  Basophils Absolute 0.0 0.0 - 0.1 K/uL  Comprehensive metabolic panel     Status: Abnormal   Collection Time: 06/05/17  4:04 AM  Result Value Ref Range   Sodium 139 135 - 145 mmol/L   Potassium 3.7 3.5 - 5.1 mmol/L   Chloride 105 101 - 111 mmol/L   CO2 24 22 - 32 mmol/L   Glucose, Bld 136 (H) 65 - 99 mg/dL   BUN 12 6 - 20 mg/dL   Creatinine, Ser 0.94 0.61 - 1.24 mg/dL   Calcium 9.0 8.9 - 10.3 mg/dL   Total Protein 7.1 6.5 - 8.1 g/dL   Albumin 4.4 3.5 - 5.0 g/dL   AST 48 (H) 15 - 41 U/L   ALT 100 (H) 17 - 63 U/L   Alkaline Phosphatase 64 38 - 126 U/L   Total Bilirubin 0.9 0.3 - 1.2 mg/dL   GFR calc non Af Amer >60 >60 mL/min   GFR calc Af Amer >60 >60 mL/min    Comment: (NOTE) The eGFR has been calculated using the CKD EPI equation. This calculation has not been validated in all clinical situations. eGFR's persistently <60 mL/min signify possible Chronic  Kidney Disease.    Anion gap 10 5 - 15  Lipase, blood     Status: None   Collection Time: 06/05/17  4:04 AM  Result Value Ref Range   Lipase 24 11 - 51 U/L   Ct Abdomen Pelvis W Contrast  Result Date: 06/05/2017 CLINICAL DATA:  Acute onset of right lower quadrant abdominal pain. Leukocytosis. Initial encounter. EXAM: CT ABDOMEN AND PELVIS WITH CONTRAST TECHNIQUE: Multidetector CT imaging of the abdomen and pelvis was performed using the standard protocol following bolus administration of intravenous contrast. CONTRAST:  100 mL of Isovue 300 IV contrast COMPARISON:  Abdominal ultrasound performed 10/25/2016 FINDINGS: Lower chest: A 2.7 cm apparent pericardial or epicardial cyst is noted at the right side of the mediastinum. Minimal bibasilar atelectasis is noted. Hepatobiliary: Small nonspecific hypodensities are noted within the liver, measuring up to 7 mm in size. The gallbladder is unremarkable in appearance. The common bile duct remains normal in caliber. Pancreas: The pancreas is within normal limits. Spleen: The spleen is unremarkable in appearance. Adrenals/Urinary Tract: The adrenal glands are unremarkable in appearance. The kidneys are within normal limits. There is no evidence of hydronephrosis. No renal or ureteral stones are identified. No perinephric stranding is seen. Stomach/Bowel: The appendix is dilated to 1.1 cm in diameter, with surrounding soft tissue inflammation, concerning for mild appendicitis. There is no evidence of perforation or abscess formation at this time. The colon is unremarkable in appearance. The small bowel is grossly unremarkable. The stomach is within normal limits. Vascular/Lymphatic: The abdominal aorta is unremarkable in appearance. The inferior vena cava is grossly unremarkable. No retroperitoneal lymphadenopathy is seen. No pelvic sidewall lymphadenopathy is identified. Reproductive: The bladder is mildly distended and grossly unremarkable. The prostate remains  normal in size. Other: No additional soft tissue abnormalities are seen. Musculoskeletal: No acute osseous abnormalities are identified. The visualized musculature is unremarkable in appearance. IMPRESSION: 1. Dilatation of the appendix to 1.1 cm in diameter, with surrounding soft tissue inflammation, reflecting mild appendicitis. No evidence of perforation or abscess formation at this time. No appendicolith seen. 2. Small nonspecific hypodensities within the liver, measuring up to 7 mm in size. 3. 2.7 cm apparent pericardial or epicardial cyst at the right side of the mediastinum. These results were called by telephone at the time of interpretation on 06/05/2017  at 6:06 am to Dr. Pryor Curia, who verbally acknowledged these results. Electronically Signed   By: Garald Balding M.D.   On: 06/05/2017 06:07    Review of Systems  Constitutional: Positive for chills. Negative for diaphoresis, fever, malaise/fatigue and weight loss.  HENT: Negative.   Eyes: Negative.   Respiratory: Negative.   Cardiovascular: Negative.   Gastrointestinal: Positive for abdominal pain (RLQ now), constipation, diarrhea (He was premature and has issues with denervated bowel.  He has intermittent constipation and diarrhea.), heartburn, nausea and vomiting. Negative for blood in stool and melena.  Genitourinary: Negative.   Musculoskeletal: Negative.   Skin: Negative.   Neurological: Negative.  Negative for weakness.  Endo/Heme/Allergies: Negative.   Psychiatric/Behavioral: Positive for depression (Hx of depression).    Blood pressure (!) 141/78, pulse 89, temperature 98.7 F (37.1 C), temperature source Oral, resp. rate 18, height _0  (1.778 m), weight 113.4 kg (250 lb), SpO2 100 %. Physical Exam  Constitutional: He is oriented to person, place, and time. He appears well-developed and well-nourished. No distress.  HENT:  Head: Normocephalic.  Left Ear: External ear normal.  Mouth/Throat: No oropharyngeal exudate.   Eyes: Right eye exhibits no discharge. Left eye exhibits no discharge. No scleral icterus.  Pupils are equal  Neck: Normal range of motion. Neck supple. No JVD present. No tracheal deviation present. No thyromegaly present.  Cardiovascular: Normal rate, regular rhythm, normal heart sounds and intact distal pulses.   No murmur heard. Respiratory: Effort normal and breath sounds normal. No respiratory distress. He has no wheezes. He has no rales. He exhibits no tenderness.  GI: Soft. He exhibits no distension and no mass. There is tenderness (primiarly RLQ). There is no rebound and no guarding.  Musculoskeletal: He exhibits no edema or tenderness.  Lymphadenopathy:    He has no cervical adenopathy.  Neurological: He is alert and oriented to person, place, and time. No cranial nerve deficit.  Skin: Skin is warm and dry. No rash noted. He is not diaphoretic. No erythema. No pallor.  Psychiatric: He has a normal mood and affect. His behavior is normal. Judgment and thought content normal.     Assessment/Plan Acute appendicitis Hx of IBD Hx of Asthma   Plan:  Continue IV abx, NPO, surgery later today when we can get him into the OR.      Johnny Gorter, PA-C 06/05/2017, 7:19 AM

## 2017-06-05 NOTE — ED Notes (Signed)
Attempted to call report. Receiving RN to call back for report. 

## 2017-06-05 NOTE — Transfer of Care (Signed)
Immediate Anesthesia Transfer of Care Note  Patient: Jonathon Murphy  Procedure(s) Performed: APPENDECTOMY LAPAROSCOPIC (N/A )  Patient Location: PACU  Anesthesia Type:General  Level of Consciousness: awake, alert  and oriented  Airway & Oxygen Therapy: Patient Spontanous Breathing and Patient connected to face mask oxygen  Post-op Assessment: Report given to RN and Post -op Vital signs reviewed and stable  Post vital signs: Reviewed and stable  Last Vitals:  Vitals:   06/05/17 0416 06/05/17 0718  BP:  (!) 105/55  Pulse:  85  Resp:  18  Temp:  36.6 C  SpO2: 100% 100%    Last Pain:  Vitals:   06/05/17 1151  TempSrc:   PainSc: 6       Patients Stated Pain Goal: 4 (06/05/17 1151)  Complications: No apparent anesthesia complications

## 2017-06-05 NOTE — Progress Notes (Signed)
Pharmacy Antibiotic Note  Jonathon Murphy is a 31 y.o. male admitted on 06/05/2017 with intra-abdominal infection.  Pharmacy has been consulted for zosyn dosing.  Plan: Zosyn 3.375g IV Q8H infused over 4hrs. Pharmacy will sign off, please re-consult if further assistance is needed  Height:  (177.8 cm) Weight: 250 lb (113.4 kg) IBW/kg (Calculated) : 73  Temp (24hrs), Avg:98.3 F (36.8 C), Min:97.8 F (36.6 C), Max:98.7 F (37.1 C)   Recent Labs Lab 06/05/17 0404  WBC 13.3*  CREATININE 0.94    Estimated Creatinine Clearance: 143.7 mL/min (by C-G formula based on SCr of 0.94 mg/dL).    Allergies  Allergen Reactions  . Bee Venom Anaphylaxis  . Sulfa Antibiotics Shortness Of Breath  . Benadryl [Diphenhydramine] Hives  . Erythromycin Other (See Comments)    Does not know  . Other Itching and Swelling    Anything in the "Nut family"  . Suprax [Cefixime] Other (See Comments)    Does not know    . Latex Rash  . Tape Rash    Paper tape is ok     Thank you for allowing pharmacy to be a part of this patient's care.  Arley Phenix RPh 06/05/2017, 8:25 AM Pager [phone]

## 2017-06-05 NOTE — ED Notes (Signed)
Pt unable to provide urine specimen at this time. Urinal by bedside. 

## 2017-06-05 NOTE — ED Notes (Signed)
Will, PA/surgery in with the patient.

## 2017-06-05 NOTE — Anesthesia Procedure Notes (Signed)
Procedure Name: Intubation Date/Time: 06/05/2017 12:51 PM Performed by: Thornell Mule Pre-anesthesia Checklist: Patient identified, Emergency Drugs available, Suction available and Patient being monitored Patient Re-evaluated:Patient Re-evaluated prior to induction Oxygen Delivery Method: Circle system utilized Preoxygenation: Pre-oxygenation with 100% oxygen Induction Type: IV induction Ventilation: Mask ventilation without difficulty Laryngoscope Size: Miller and 3 Grade View: Grade II Tube type: Oral Tube size: 7.5 mm Number of attempts: 1 Airway Equipment and Method: Stylet and Oral airway Placement Confirmation: ETT inserted through vocal cords under direct vision,  positive ETCO2 and breath sounds checked- equal and bilateral Secured at: 22 cm Tube secured with: Tape Dental Injury: Teeth and Oropharynx as per pre-operative assessment

## 2017-06-06 ENCOUNTER — Encounter (HOSPITAL_COMMUNITY): Payer: Self-pay | Admitting: General Surgery

## 2017-06-06 LAB — BASIC METABOLIC PANEL
ANION GAP: 6 (ref 5–15)
BUN: 12 mg/dL (ref 6–20)
CALCIUM: 8.5 mg/dL — AB (ref 8.9–10.3)
CO2: 23 mmol/L (ref 22–32)
CREATININE: 0.88 mg/dL (ref 0.61–1.24)
Chloride: 108 mmol/L (ref 101–111)
Glucose, Bld: 152 mg/dL — ABNORMAL HIGH (ref 65–99)
Potassium: 4.3 mmol/L (ref 3.5–5.1)
SODIUM: 137 mmol/L (ref 135–145)

## 2017-06-06 LAB — CBC
HCT: 38.4 % — ABNORMAL LOW (ref 39.0–52.0)
Hemoglobin: 13.4 g/dL (ref 13.0–17.0)
MCH: 28.4 pg (ref 26.0–34.0)
MCHC: 34.9 g/dL (ref 30.0–36.0)
MCV: 81.4 fL (ref 78.0–100.0)
PLATELETS: 196 10*3/uL (ref 150–400)
RBC: 4.72 MIL/uL (ref 4.22–5.81)
RDW: 12.8 % (ref 11.5–15.5)
WBC: 15.1 10*3/uL — AB (ref 4.0–10.5)

## 2017-06-06 LAB — HIV ANTIBODY (ROUTINE TESTING W REFLEX): HIV Screen 4th Generation wRfx: NONREACTIVE

## 2017-06-06 LAB — MAGNESIUM: MAGNESIUM: 2.2 mg/dL (ref 1.7–2.4)

## 2017-06-06 MED ORDER — OXYCODONE HCL 5 MG PO TABS: 5.0000 mg | ORAL_TABLET | ORAL | 0 refills | Status: DC | PRN

## 2017-06-06 MED ORDER — CHLORHEXIDINE GLUCONATE CLOTH 2 % EX PADS: 6.0000 | MEDICATED_PAD | Freq: Every day | CUTANEOUS | Status: DC

## 2017-06-06 MED ORDER — MUPIROCIN 2 % EX OINT: 1.0000 "application " | TOPICAL_OINTMENT | Freq: Two times a day (BID) | CUTANEOUS | Status: DC

## 2017-06-06 MED ORDER — ONDANSETRON 4 MG PO TBDP: 4.0000 mg | ORAL_TABLET | Freq: Four times a day (QID) | ORAL | 0 refills | Status: DC | PRN

## 2017-06-06 NOTE — Discharge Summary (Signed)
[facility]/Trauma Discharge Summary   Patient ID: Jonathon Murphy MRN: [id number] DOB/AGE: 04-25-1986 31 y.o.  Admit date: 06/05/2017 Discharge date: 06/06/2017  Admitting Diagnosis: Acute appendicitis  Discharge Diagnosis Patient Active Problem List   Diagnosis Date Noted  . Acute appendicitis 06/05/2017    Consultants none  Imaging: Ct Abdomen Pelvis W Contrast  Result Date: 06/05/2017 CLINICAL DATA:  Acute onset of right lower quadrant abdominal pain. Leukocytosis. Initial encounter. EXAM: CT ABDOMEN AND PELVIS WITH CONTRAST TECHNIQUE: Multidetector CT imaging of the abdomen and pelvis was performed using the standard protocol following bolus administration of intravenous contrast. CONTRAST:  100 mL of Isovue 300 IV contrast COMPARISON:  Abdominal ultrasound performed 10/25/2016 FINDINGS: Lower chest: A 2.7 cm apparent pericardial or epicardial cyst is noted at the right side of the mediastinum. Minimal bibasilar atelectasis is noted. Hepatobiliary: Small nonspecific hypodensities are noted within the liver, measuring up to 7 mm in size. The gallbladder is unremarkable in appearance. The common bile duct remains normal in caliber. Pancreas: The pancreas is within normal limits. Spleen: The spleen is unremarkable in appearance. Adrenals/Urinary Tract: The adrenal glands are unremarkable in appearance. The kidneys are within normal limits. There is no evidence of hydronephrosis. No renal or ureteral stones are identified. No perinephric stranding is seen. Stomach/Bowel: The appendix is dilated to 1.1 cm in diameter, with surrounding soft tissue inflammation, concerning for mild appendicitis. There is no evidence of perforation or abscess formation at this time. The colon is unremarkable in appearance. The small bowel is grossly unremarkable. The stomach is within normal limits. Vascular/Lymphatic: The abdominal aorta is unremarkable in appearance. The inferior vena cava is  grossly unremarkable. No retroperitoneal lymphadenopathy is seen. No pelvic sidewall lymphadenopathy is identified. Reproductive: The bladder is mildly distended and grossly unremarkable. The prostate remains normal in size. Other: No additional soft tissue abnormalities are seen. Musculoskeletal: No acute osseous abnormalities are identified. The visualized musculature is unremarkable in appearance. IMPRESSION: 1. Dilatation of the appendix to 1.1 cm in diameter, with surrounding soft tissue inflammation, reflecting mild appendicitis. No evidence of perforation or abscess formation at this time. No appendicolith seen. 2. Small nonspecific hypodensities within the liver, measuring up to 7 mm in size. 3. 2.7 cm apparent pericardial or epicardial cyst at the right side of the mediastinum. These results were called by telephone at the time of interpretation on 06/05/2017 at 6:06 am to Dr. Rochele Raring, who verbally acknowledged these results. Electronically Signed   By: Roanna Raider M.D.   On: 06/05/2017 06:07    Procedures Dr. Andrey Campanile (06/05/17) - Laparoscopic Appendectomy    Hospital Course:  Pt is a 31 year old male who presented to ED with abdominal pain.  Workup showed acute appendicitis.  Patient was admitted and underwent procedure listed above.  Tolerated procedure well and was transferred to the floor.  Diet was advanced as tolerated.  On POD#1, the patient was voiding well, tolerating diet, ambulating well, pain well controlled, vital signs stable, incisions c/d/i and felt stable for discharge home.  Patient will follow up in our office in 2 weeks and knows to call with questions or concerns.  He will call to confirm appointment date/time.    Patient was discharged in good condition.  The [place] Substance controlled database was reviewed prior to prescribing narcotic pain medication to this patient.  Physical Exam: General:  Alert, NAD, pleasant, cooperative HEENT: mild conjunctival  hemorrhages to lateral and medial aspect of b/l eye, no visual changes, PERRL  Cardio: RRR, S1 & S2 normal, no murmur, rubs, gallops Resp: Rate and effort normal, lungs CTA bilaterally, no wheezes, rales, rhonchi Abd:  Soft, ND, normal bowel sounds, mild TTP around umbilicus, no guarding, incisions with glue without signs of infection or drainage Skin: warm and dry  Allergies as of 06/06/2017      Reactions   Bee Venom Anaphylaxis   Sulfa Antibiotics Shortness Of Breath   Benadryl [diphenhydramine] Hives   Erythromycin Other (See Comments)   Does not know   Other Itching, Swelling   Anything in the "Nut family"   Suprax [cefixime] Other (See Comments)   Does not know    Latex Rash   Tape Rash   Paper tape is ok      Medication List    TAKE these medications   albuterol 108 (90 Base) MCG/ACT inhaler Commonly known as:  PROVENTIL HFA;VENTOLIN HFA Inhale 2 puffs into the lungs every 6 (six) hours as needed for wheezing or shortness of breath (cough, shortness of breath or wheezing.).   ondansetron 4 MG disintegrating tablet Commonly known as:  ZOFRAN-ODT Take 1 tablet (4 mg total) by mouth every 6 (six) hours as needed for nausea.   oxyCODONE 5 MG immediate release tablet Commonly known as:  Oxy IR/ROXICODONE Take 1 tablet (5 mg total) by mouth every 4 (four) hours as needed for moderate pain.        Follow-up Information    [facility] Follow up on 06/20/2017.   Specialty:  General Surgery Why:  Your appointment is at 1:30 PM, be at the office 30 minutes early for check in.  Bring insurance information and photo ID, Contact information: [address] [place] [address] [phone]           Signed: Joyce Copa [facility] 06/06/2017, 9:22 AM Pager: [phone] Consults: [phone] Mon-Fri 7:00 am-4:30 pm Sat-Sun 7:00 am-11:30 am

## 2017-06-06 NOTE — Discharge Instructions (Signed)
[facility] ______[facility] °LAPAROSCOPIC SURGERY: POST OP INSTRUCTIONS °Always review your discharge instruction sheet given to you by the facility where your surgery was performed. °IF YOU HAVE DISABILITY OR FAMILY LEAVE FORMS, YOU MUST BRING THEM TO THE OFFICE FOR PROCESSING.   °DO NOT GIVE THEM TO YOUR DOCTOR. ° °1. A prescription for pain medication may be given to you upon discharge.  Take your pain medication as prescribed, if needed.  If narcotic pain medicine is not needed, then you may take acetaminophen (Tylenol) or ibuprofen (Advil) as needed. °2. Take your usually prescribed medications unless otherwise directed. °3. If you need a refill on your pain medication, please contact your pharmacy.  They will contact our office to request authorization. Prescriptions will not be filled after 5pm or on week-ends. °4. You should follow a light diet the first few days after arrival home, such as soup and crackers, etc.  Be sure to include lots of fluids daily. °5. Most patients will experience some swelling and bruising in the area of the incisions.  Ice packs will help.  Swelling and bruising can take several days to resolve.  °6. It is common to experience some constipation if taking pain medication after surgery.  Increasing fluid intake and taking a stool softener (such as Colace) will usually help or prevent this problem from occurring.  A mild laxative (Milk of Magnesia or Miralax) should be taken according to package instructions if there are no bowel movements after 48 hours. °7. Unless discharge instructions indicate otherwise, you may remove your bandages 24-48 hours after surgery, and you may shower at that time.  You may have steri-strips (small skin tapes) in place directly over the incision.  These strips should be left on the skin for 7-10 days.  If your surgeon used skin glue on the incision, you may shower in 24 hours.  The glue will flake off over the next 2-3 weeks.  Any sutures or  staples will be removed at the office during your follow-up visit. °8. ACTIVITIES:  You may resume regular (light) daily activities beginning the next day--such as daily self-care, walking, climbing stairs--gradually increasing activities as tolerated.  You may have sexual intercourse when it is comfortable.  Refrain from any heavy lifting or straining until approved by your doctor. °a. You may drive when you are no longer taking prescription pain medication, you can comfortably wear a seatbelt, and you can safely maneuver your car and apply brakes. °b. RETURN TO WORK:  __________________________________________________________ °9. You should see your doctor in the office for a follow-up appointment approximately 2-3 weeks after your surgery.  Make sure that you call for this appointment within a day or two after you arrive home to insure a convenient appointment time. °10. OTHER INSTRUCTIONS: __________________________________________________________________________________________________________________________ __________________________________________________________________________________________________________________________ °WHEN TO CALL YOUR DOCTOR: °1. Fever over 101.0 °2. Inability to urinate °3. Continued bleeding from incision. °4. Increased pain, redness, or drainage from the incision. °5. Increasing abdominal pain ° °The clinic staff is available to answer your questions during regular business hours.  Please don’t hesitate to call and ask to speak to one of the nurses for clinical concerns.  If you have a medical emergency, go to the nearest emergency room or call 911.  A surgeon from [facility] is always on call at the hospital. °[address], [address], [place], [place]  [address] ? [address] [address], [place],    [address] °[phone] ? [phone] ? FAX [phone] °Web site:   [email or web address]   Laparoscopic Appendectomy, Adult, Care After Refer to  this sheet in the next few weeks. These instructions provide you with information about caring for yourself after your procedure. Your health care provider may also give you more specific instructions. Your treatment has been planned according to current medical practices, but problems sometimes occur. Call your health care provider if you have any problems or questions after your procedure. What can I expect after the procedure? After the procedure, it is common to have:  A decrease in your energy level.  Mild pain in the area where the surgical cuts (incisions) were made.  Constipation. This can be caused by pain medicine and a decrease in your activity.  Follow these instructions at home: Medicines  Take over-the-counter and prescription medicines only as told by your health care provider.  Do not drive for 24 hours if you received a sedative.  Do not drive or operate heavy machinery while taking prescription pain medicine.  If you were prescribed an antibiotic medicine, take it as told by your health care provider. Do not stop taking the antibiotic even if you start to feel better. Activity  For 3 weeks or as long as told by your health care provider: ? Do not lift anything that is heavier than 10 pounds (4.5 kg). ? Do not play contact sports.  Gradually return to your normal activities. Ask your health care provider what activities are safe for you. Bathing  Keep your incisions clean and dry. Clean them as often as told by your health care provider: ? Gently wash the incisions with soap and water. ? Rinse the incisions with water to remove all soap. ? Pat the incisions dry with a clean towel. Do not rub the incisions.  You may take showers after 48 hours.  Do not take baths, swim, or use hot tubs for 2 weeks or as told by your health care provider. Incision care  Follow instructions from your healthcare provider about how to take care of your incisions. Make sure  you: ? Wash your hands with soap and water before you change your bandage (dressing). If soap and water are not available, use hand sanitizer. ? Change your dressing as told by your health care provider. ? Leave stitches (sutures), skin glue, or adhesive strips in place. These skin closures may need to stay in place for 2 weeks or longer. If adhesive strip edges start to loosen and curl up, you may trim the loose edges. Do not remove adhesive strips completely unless your health care provider tells you to do that.  Check your incision areas every day for signs of infection. Check for: ? More redness, swelling, or pain. ? More fluid or blood. ? Warmth. ? Pus or a bad smell. Other Instructions  If you were sent home with a drain, follow instructions from your health care provider about how to care for the drain and how to empty it.  Take deep breaths. This helps to prevent your lungs from becoming inflamed.  To relieve and prevent constipation: ? Drink plenty of fluids. ? Eat plenty of fruits and vegetables.  Keep all follow-up visits as told by your health care provider. This is important. Contact a health care provider if:  You have more redness, swelling, or pain around an incision.  You have more fluid or blood coming from an incision.  Your incision feels warm to the touch.  You have pus or a bad smell coming from an incision or  dressing.  Your incision edges break open after your sutures have been removed.  You have increasing pain in your shoulders.  You feel dizzy or you faint.  You develop shortness of breath.  You keep feeling nauseous or vomiting.  You have diarrhea or you cannot control your bowel functions.  You lose your appetite.  You develop swelling or pain in your legs. Get help right away if:  You have a fever.  You develop a rash.  You have difficulty breathing.  You have sharp pains in your chest. This information is not intended to replace  advice given to you by your health care provider. Make sure you discuss any questions you have with your health care provider. Document Released: 08/13/2005 Document Revised: 01/13/2016 Document Reviewed: 01/31/2015 Elsevier Interactive Patient Education  2017 [employer] Inc[employer]   Soft-Food Meal Plan Follow for 3-4 days after discharge A soft-food meal plan includes foods that are safe and easy to swallow. This meal plan typically is used:  If you are having trouble chewing or swallowing foods.  As a transition meal plan after only having had liquid meals for a long period.  What do I need to know about the soft-food meal plan? A soft-food meal plan includes tender foods that are soft and easy to chew and swallow. In most cases, bite-sized pieces of food are easier to swallow. A bite-sized piece is about  inch or smaller. Foods in this plan do not need to be ground or pureed. Foods that are very hard, crunchy, or sticky should be avoided. Also, breads, cereals, yogurts, and desserts with nuts, seeds, or fruits should be avoided. What foods can I eat? Grains Rice and wild rice. Moist bread, dressing, pasta, and noodles. Well-moistened dry or cooked cereals, such as farina (cooked wheat cereal), oatmeal, or grits. Biscuits, breads, muffins, pancakes, and waffles that have been well moistened. Vegetables Shredded lettuce. Cooked, tender vegetables, including potatoes without skins. Vegetable juices. Broths or creamed soups made with vegetables that are not stringy or chewy. Strained tomatoes (without seeds). Fruits Canned or well-cooked fruits. Soft (ripe), peeled fresh fruits, such as peaches, nectarines, kiwi, cantaloupe, honeydew melon, and watermelon (without seeds). Soft berries with small seeds, such as strawberries. Fruit juices (without pulp). Meats and Other Protein Sources Moist, tender, lean beef. Mutton. Lamb. Veal. Chicken. [place]. Liver. Ham. Fish without bones. Eggs. Dairy Milk,  milk drinks, and cream. Plain cream cheese and cottage cheese. Plain yogurt. Sweets/Desserts Flavored gelatin desserts. Custard. Plain ice cream, frozen yogurt, sherbet, milk shakes, and malts. Plain cakes and cookies. Plain hard candy. Other Butter, margarine (without trans fat), and cooking oils. Mayonnaise. Cream sauces. Mild spices, salt, and sugar. Syrup, molasses, honey, and jelly. The items listed above may not be a complete list of recommended foods or beverages. Contact your dietitian for more options. What foods are not recommended? Grains Dry bread, toast, crackers that have not been moistened. Coarse or dry cereals, such as bran, granola, and shredded wheat. Tough or chewy crusty breads, such as [place] bread or baguettes. Vegetables Corn. Raw vegetables except shredded lettuce. Cooked vegetables that are tough or stringy. Tough, crisp, fried potatoes and potato skins. Fruits Fresh fruits with skins or seeds or both, such as apples, pears, or grapes. Stringy, high-pulp fruits, such as papaya, pineapple, coconut, or mango. Fruit leather, fruit roll-ups, and all dried fruits. Meats and Other Protein Sources Sausages and hot dogs. Meats with gristle. Fish with bones. Nuts, seeds, and chunky peanut or other nut  butters. Sweets/Desserts Cakes or cookies that are very dry or chewy. The items listed above may not be a complete list of foods and beverages to avoid. Contact your dietitian for more information. This information is not intended to replace advice given to you by your health care provider. Make sure you discuss any questions you have with your health care provider. Document Released: 11/20/2007 Document Revised: 01/19/2016 Document Reviewed: 07/10/2013 Elsevier Interactive Patient Education  2017 [employer].

## 2018-01-14 LAB — BASIC METABOLIC PANEL
BUN: 15 (ref 4–21)
CO2: 22 (ref 13–22)
Chloride: 106 (ref 99–108)
Creatinine: 1 (ref 0.6–1.3)
Glucose: 1
Potassium: 4.1 (ref 3.4–5.3)

## 2018-01-14 LAB — CBC AND DIFFERENTIAL
HCT: 41 (ref 41–53)
HCT: 41 (ref 41–53)
Hemoglobin: 14.2 (ref 13.5–17.5)
Hemoglobin: 14.2 (ref 13.5–17.5)
Platelets: 177 (ref 150–399)
Platelets: 177 (ref 150–399)
WBC: 6.7

## 2018-01-14 LAB — CBC
RBC: 4.82 (ref 3.87–5.11)
RBC: 4.82 (ref 3.87–5.11)

## 2018-01-14 LAB — LIPID PANEL
Cholesterol: 174 (ref 0–200)
HDL: 41 (ref 35–70)
LDL Cholesterol: 118
Triglycerides: 75 (ref 40–160)

## 2018-01-14 LAB — COMPREHENSIVE METABOLIC PANEL
Albumin: 4.4 (ref 3.5–5.0)
Calcium: 8.8 (ref 8.7–10.7)
GFR calc Af Amer: 114
GFR calc non Af Amer: 99
Globulin: 1.8

## 2018-01-14 LAB — HEMOGLOBIN A1C: Hemoglobin A1C: 5.6

## 2018-01-14 LAB — TSH
TSH: 3.32 (ref 0.41–5.90)
TSH: 3.32 (ref 0.41–5.90)

## 2018-01-14 LAB — HEPATIC FUNCTION PANEL: Bilirubin, Total: 0.6

## 2018-01-14 LAB — HIV ANTIBODY (ROUTINE TESTING W REFLEX): HIV 1&2 Ab, 4th Generation: NONREACTIVE

## 2018-06-23 ENCOUNTER — Ambulatory Visit
Admission: RE | Admit: 2018-06-23 | Discharge: 2018-06-23 | Disposition: A | Discharge status: Home / Self Care | Payer: BC Managed Care – PPO | Source: Ambulatory Visit | Attending: Family Medicine | Admitting: Family Medicine

## 2018-06-23 ENCOUNTER — Other Ambulatory Visit: Payer: Self-pay | Admitting: Family Medicine

## 2018-06-23 DIAGNOSIS — J189 Pneumonia, unspecified organism: Secondary | ICD-10-CM

## 2019-05-19 ENCOUNTER — Other Ambulatory Visit: Payer: Self-pay

## 2019-05-19 ENCOUNTER — Encounter: Payer: Self-pay | Admitting: Family Medicine

## 2019-05-19 ENCOUNTER — Ambulatory Visit (INDEPENDENT_AMBULATORY_CARE_PROVIDER_SITE_OTHER): Payer: BC Managed Care – PPO | Admitting: Family Medicine

## 2019-05-19 VITALS — BP 110/84 | HR 100 | Temp 97.9°F | Ht 70.0 in | Wt 255.5 lb

## 2019-05-19 DIAGNOSIS — Z23 Encounter for immunization: Secondary | ICD-10-CM

## 2019-05-19 DIAGNOSIS — Q431 Hirschsprung's disease: Secondary | ICD-10-CM

## 2019-05-19 DIAGNOSIS — R739 Hyperglycemia, unspecified: Secondary | ICD-10-CM | POA: Diagnosis not present

## 2019-05-19 DIAGNOSIS — Z13220 Encounter for screening for lipoid disorders: Secondary | ICD-10-CM | POA: Diagnosis not present

## 2019-05-19 DIAGNOSIS — G43909 Migraine, unspecified, not intractable, without status migrainosus: Secondary | ICD-10-CM | POA: Insufficient documentation

## 2019-05-19 DIAGNOSIS — J45909 Unspecified asthma, uncomplicated: Secondary | ICD-10-CM | POA: Insufficient documentation

## 2019-05-19 DIAGNOSIS — E669 Obesity, unspecified: Secondary | ICD-10-CM

## 2019-05-19 DIAGNOSIS — G43809 Other migraine, not intractable, without status migrainosus: Secondary | ICD-10-CM | POA: Diagnosis not present

## 2019-05-19 DIAGNOSIS — Z6836 Body mass index (BMI) 36.0-36.9, adult: Secondary | ICD-10-CM

## 2019-05-19 DIAGNOSIS — Z0001 Encounter for general adult medical examination with abnormal findings: Secondary | ICD-10-CM

## 2019-05-19 DIAGNOSIS — J4520 Mild intermittent asthma, uncomplicated: Secondary | ICD-10-CM

## 2019-05-19 DIAGNOSIS — Z87892 Personal history of anaphylaxis: Secondary | ICD-10-CM

## 2019-05-19 MED ORDER — EPINEPHRINE 0.3 MG/0.3ML IJ SOAJ: 0.3000 mg | INTRAMUSCULAR | 0 refills | Status: DC | PRN

## 2019-05-19 NOTE — Assessment & Plan Note (Signed)
Continue albuterol as needed 

## 2019-05-19 NOTE — Assessment & Plan Note (Signed)
Print off a prescription for EpiPen.

## 2019-05-19 NOTE — Progress Notes (Signed)
 Chief Complaint:  Jonathon Murphy is a 33 y.o. male who presents today for his annual comprehensive physical exam.    Assessment/Plan:  History of anaphylaxis Print off a prescription for EpiPen.  Migraine Patient will let me know which prescription he is taking and let me know when he needs a refill.  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   Hirschsprung's disease Continue laxatives as needed.  Hyperglycemia Check A1c.  Body mass index is 36.66 kg/m. / Obese BMI Metric Follow Up - 05/19/19 1516      BMI Metric Follow Up-Please document annually   BMI Metric Follow Up  Education provided       Preventative Healthcare: Flu vaccine given today.  Check CBC, C met, TSH, and lipid panel.  Patient Counseling(The following topics were reviewed and/or handout was given):  -Nutrition: Stressed importance of moderation in sodium/caffeine intake, saturated fat and cholesterol, caloric balance, sufficient intake of fresh fruits, vegetables, and fiber.  -Stressed the importance of regular exercise.   -Substance Abuse: Discussed cessation/primary prevention of tobacco, alcohol, or other drug use; driving or other dangerous activities under the influence; availability of treatment for abuse.   -Injury prevention: Discussed safety belts, safety helmets, smoke detector, smoking near bedding or upholstery.   -Sexuality: Discussed sexually transmitted diseases, partner selection, use of condoms, avoidance of unintended pregnancy and contraceptive alternatives.   -Dental health: Discussed importance of regular tooth brushing, flossing, and dental visits.  -Health maintenance and immunizations reviewed. Please refer to Health maintenance section.  Return to care in 1 year for next preventative visit.     Subjective:  HPI:  He has no acute complaints today.   His stable, chronic medical conditions are outlined below:   # Hirschsprungs  Disease - Uses OTC laxatives as needed.  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  Exercise: Likes to walk. Limited due to "lung scarring" from flu pneumonia.  Depression screen PHQ 2/9 05/19/2019  Decreased Interest 2  Down, Depressed, Hopeless 1  PHQ - 2 Score 3  Altered sleeping 0  Tired, decreased energy 1  Change in appetite 0  Feeling bad or failure about yourself  2  Trouble concentrating 0  Moving slowly or fidgety/restless 0  Suicidal thoughts 0  PHQ-9 Score 6   Health Maintenance Due  Topic Date Due  . INFLUENZA VACCINE  03/28/2019     ROS: Per HPI, otherwise a complete review of systems was negative.   PMH:  The following were reviewed and entered/updated in epic: Past Medical History:  Diagnosis Date  . Allergy   . Anxiety   . Asthma   . Concussion   . Depression   . Migraines    Patient Active Problem List   Diagnosis Date Noted  . 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 Migraine 05/19/2019  . History of anaphylaxis 05/19/2019   Past Surgical History:  Procedure Laterality Date  . ADENOIDECTOMY    . APPENDECTOMY    . LAPAROSCOPIC APPENDECTOMY N/A 06/05/2017   Procedure: APPENDECTOMY LAPAROSCOPIC;  Surgeon: Wilson, Eric, MD;  Location: [facility];  Service: General;  Laterality: N/A;    Family History  Problem Relation Age of Onset  . Arthritis   Mother   . Miscarriages / Stillbirths Mother   . Arthritis Father   . Diabetes Father   . Heart disease Father   . Arthritis Sister   . Depression Sister   . Arthritis Maternal Grandmother   . Diabetes Maternal Grandmother   . Cancer Maternal Grandfather   . Heart disease Paternal Grandmother   . Cancer Paternal Grandfather   .  Drug abuse Paternal Grandfather     Medications- reviewed and updated Current Outpatient Medications  Medication Sig Dispense Refill  . albuterol (PROVENTIL HFA;VENTOLIN HFA) 108 (90 Base) MCG/ACT inhaler Inhale 2 puffs into the lungs every 6 (six) hours as needed for wheezing or shortness of breath (cough, shortness of breath or wheezing.). 1 Inhaler 1  . EPINEPHrine 0.3 mg/0.3 mL IJ SOAJ injection Inject 0.3 mLs (0.3 mg total) into the muscle as needed. 2 each 0   No current facility-administered medications for this visit.     Allergies-reviewed and updated Allergies  Allergen Reactions  . Bee Venom Anaphylaxis  . Sulfa Antibiotics Shortness Of Breath  . Benadryl [Diphenhydramine] Hives  . Erythromycin Other (See Comments)    Does not know  . Other Itching and Swelling    Anything in the "Nut family"  . Suprax [Cefixime] Other (See Comments)    Does not know    . Latex Rash  . Tape Rash    Paper tape is ok    Social History   Socioeconomic History  . Marital status: Married    Spouse name: Not on file  . Number of children: Not on file  . Years of education: Not on file  . Highest education level: Not on file  Occupational History  . Not on file  Social Needs  . Financial resource strain: Not on file  . Food insecurity    Worry: Not on file    Inability: Not on file  . Transportation needs    Medical: Not on file    Non-medical: Not on file  Tobacco Use  . Smoking status: Never Smoker  . Smokeless tobacco: Never Used  Substance and Sexual Activity  . Alcohol use: Yes    Alcohol/week: 0.0 standard drinks    Comment: Occ  . Drug use: No  . Sexual activity: Not on file  Lifestyle  . Physical activity    Days per week: Not on file    Minutes per session: Not on file  . Stress: Not on file  Relationships  . Social connections    Talks on phone: Not on file    Gets together: Not on file    Attends religious service: Not on file    Active member of club  or organization: Not on file    Attends meetings of clubs or organizations: Not on file    Relationship status: Not on file  Other Topics Concern  . Not on file  Social History Narrative  . Not on file        Objective:  Physical Exam: BP 110/84   Pulse 100   Temp 97.9 F (36.6 C)   Ht 5' 10" (1.778 m)   Wt 255 lb 8 oz (115.9 kg)   SpO2 97%   BMI 36.66 kg/m   Body mass index is 36.66 kg/m. Wt Readings from Last 3 Encounters:  05/19/19 255 lb 8 oz (115.9 kg)  06/05/17 250 lb (113.4 kg)  11/11/15 253 lb 3.2 oz (114.9 kg)   Gen: NAD, resting comfortably HEENT: TMs normal   bilaterally. OP clear. No thyromegaly noted.  CV: RRR with no murmurs appreciated Pulm: NWOB, CTAB with no crackles, wheezes, or rhonchi GI: Normal bowel sounds present. Soft, Nontender, Nondistended. MSK: no edema, cyanosis, or clubbing noted Skin: warm, dry Neuro: CN2-12 grossly intact. Strength 5/5 in upper and lower extremities. Reflexes symmetric and intact bilaterally.  Psych: Normal affect and thought content     Caleb M. Parker, MD 05/19/2019 3:30 PM  

## 2019-05-19 NOTE — Assessment & Plan Note (Addendum)
Patient will let me know which prescription he is taking and let me know when he needs a refill.

## 2019-05-19 NOTE — Assessment & Plan Note (Signed)
Continue laxatives as needed 

## 2019-05-19 NOTE — Patient Instructions (Addendum)
It was very nice to see you today!  We will check blood work today.   Come back in a year for your next physical, or sooner if needed.  Take care, Dr Jerline Pain  Please try these tips to maintain a healthy lifestyle:   Eat at least 3 REAL meals and 1-2 snacks per day.  Aim for no more than 5 hours between eating.  If you eat breakfast, please do so within one hour of getting up.     Obtain twice as many fruits/vegetables as protein or carbohydrate foods for both lunch and dinner. (Half of each meal should be fruits/vegetables, one quarter protein, and one quarter starchy carbs)   Cut down on sweet beverages. This includes juice, soda, and sweet tea.    Exercise at least 150 minutes every week.    Preventive Care 93-43 Years Old, Male Preventive care refers to lifestyle choices and visits with your health care provider that can promote health and wellness. This includes:  A yearly physical exam. This is also called an annual well check.  Regular dental and eye exams.  Immunizations.  Screening for certain conditions.  Healthy lifestyle choices, such as eating a healthy diet, getting regular exercise, not using drugs or products that contain nicotine and tobacco, and limiting alcohol use. What can I expect for my preventive care visit? Physical exam Your health care provider will check:  Height and weight. These may be used to calculate body mass index (BMI), which is a measurement that tells if you are at a healthy weight.  Heart rate and blood pressure.  Your skin for abnormal spots. Counseling Your health care provider may ask you questions about:  Alcohol, tobacco, and drug use.  Emotional well-being.  Home and relationship well-being.  Sexual activity.  Eating habits.  Work and work Statistician. What immunizations do I need?  Influenza (flu) vaccine  This is recommended every year. Tetanus, diphtheria, and pertussis (Tdap) vaccine  You may need a Td  booster every 10 years. Varicella (chickenpox) vaccine  You may need this vaccine if you have not already been vaccinated. Human papillomavirus (HPV) vaccine  If recommended by your health care provider, you may need three doses over 6 months. Measles, mumps, and rubella (MMR) vaccine  You may need at least one dose of MMR. You may also need a second dose. Meningococcal conjugate (MenACWY) vaccine  One dose is recommended if you are 35-78 years old and a Market researcher living in a residence hall, or if you have one of several medical conditions. You may also need additional booster doses. Pneumococcal conjugate (PCV13) vaccine  You may need this if you have certain conditions and were not previously vaccinated. Pneumococcal polysaccharide (PPSV23) vaccine  You may need one or two doses if you smoke cigarettes or if you have certain conditions. Hepatitis A vaccine  You may need this if you have certain conditions or if you travel or work in places where you may be exposed to hepatitis A. Hepatitis B vaccine  You may need this if you have certain conditions or if you travel or work in places where you may be exposed to hepatitis B. Haemophilus influenzae type b (Hib) vaccine  You may need this if you have certain risk factors. You may receive vaccines as individual doses or as more than one vaccine together in one shot (combination vaccines). Talk with your health care provider about the risks and benefits of combination vaccines. What tests do I need?  Blood tests  Lipid and cholesterol levels. These may be checked every 5 years starting at age 14.  Hepatitis C test.  Hepatitis B test. Screening   Diabetes screening. This is done by checking your blood sugar (glucose) after you have not eaten for a while (fasting).  Sexually transmitted disease (STD) testing. Talk with your health care provider about your test results, treatment options, and if necessary, the need  for more tests. Follow these instructions at home: Eating and drinking   Eat a diet that includes fresh fruits and vegetables, whole grains, lean protein, and low-fat dairy products.  Take vitamin and mineral supplements as recommended by your health care provider.  Do not drink alcohol if your health care provider tells you not to drink.  If you drink alcohol: ? Limit how much you have to 0-2 drinks a day. ? Be aware of how much alcohol is in your drink. In the [place] one drink equals one 12 oz bottle of beer (355 mL), one 5 oz glass of wine (148 mL), or one 1 oz glass of hard liquor (44 mL). Lifestyle  Take daily care of your teeth and gums.  Stay active. Exercise for at least 30 minutes on 5 or more days each week.  Do not use any products that contain nicotine or tobacco, such as cigarettes, e-cigarettes, and chewing tobacco. If you need help quitting, ask your health care provider.  If you are sexually active, practice safe sex. Use a condom or other form of protection to prevent STIs (sexually transmitted infections). What's next?  Go to your health care provider once a year for a well check visit.  Ask your health care provider how often you should have your eyes and teeth checked.  Stay up to date on all vaccines. This information is not intended to replace advice given to you by your health care provider. Make sure you discuss any questions you have with your health care provider. Document Released: 10/09/2001 Document Revised: 08/07/2018 Document Reviewed: 08/07/2018 Elsevier Patient Education  2020 [employer].

## 2019-05-20 LAB — TSH: TSH: 2.68 u[IU]/mL (ref 0.35–4.50)

## 2019-05-20 LAB — LIPID PANEL
Cholesterol: 194 mg/dL (ref 0–200)
HDL: 34.4 mg/dL — ABNORMAL LOW (ref >39.00)
NonHDL: 159.1
Total CHOL/HDL Ratio: 6
Triglycerides: 249 mg/dL — ABNORMAL HIGH (ref 0.0–149.0)
VLDL: 49.8 mg/dL — ABNORMAL HIGH (ref 0.0–40.0)

## 2019-05-20 LAB — COMPREHENSIVE METABOLIC PANEL
ALT: 105 U/L — ABNORMAL HIGH (ref 0–53)
AST: 47 U/L — ABNORMAL HIGH (ref 0–37)
Albumin: 4.4 g/dL (ref 3.5–5.2)
Alkaline Phosphatase: 69 U/L (ref 39–117)
BUN: 13 mg/dL (ref 6–23)
CO2: 26 mEq/L (ref 19–32)
Calcium: 9.5 mg/dL (ref 8.4–10.5)
Chloride: 102 mEq/L (ref 96–112)
Creatinine, Ser: 0.99 mg/dL (ref 0.40–1.50)
GFR: 86.97 mL/min (ref >60.00)
Glucose, Bld: 115 mg/dL — ABNORMAL HIGH (ref 70–99)
Potassium: 3.7 mEq/L (ref 3.5–5.1)
Sodium: 139 mEq/L (ref 135–145)
Total Bilirubin: 0.6 mg/dL (ref 0.2–1.2)
Total Protein: 6.6 g/dL (ref 6.0–8.3)

## 2019-05-20 LAB — CBC
HCT: 43.5 % (ref 39.0–52.0)
Hemoglobin: 15.3 g/dL (ref 13.0–17.0)
MCHC: 35.2 g/dL (ref 30.0–36.0)
MCV: 82.7 fl (ref 78.0–100.0)
Platelets: 251 10*3/uL (ref 150.0–400.0)
RBC: 5.26 Mil/uL (ref 4.22–5.81)
RDW: 13.8 % (ref 11.5–15.5)
WBC: 8.3 10*3/uL (ref 4.0–10.5)

## 2019-05-20 LAB — LDL CHOLESTEROL, DIRECT: Direct LDL: 141 mg/dL

## 2019-05-20 LAB — HEMOGLOBIN A1C: Hgb A1c MFr Bld: 6.1 % (ref 4.6–6.5)

## 2019-05-21 ENCOUNTER — Encounter: Payer: Self-pay | Admitting: Family Medicine

## 2019-05-21 DIAGNOSIS — E785 Hyperlipidemia, unspecified: Secondary | ICD-10-CM | POA: Insufficient documentation

## 2019-05-21 DIAGNOSIS — R7989 Other specified abnormal findings of blood chemistry: Secondary | ICD-10-CM | POA: Insufficient documentation

## 2019-05-21 DIAGNOSIS — R739 Hyperglycemia, unspecified: Secondary | ICD-10-CM | POA: Insufficient documentation

## 2019-05-21 NOTE — Progress Notes (Signed)
Please inform patient of the following:  His blood sugar is borderline diabetic. Recommend starting metformin 500mg  daily to reduce blood sugar and lower risk of developing diabetes. Please send in for patient if he is willing to start. Regardless, he should continue to work on diet and exercise and we can recheck in a year.  His cholesterol is also borderline. Would like for him to work on diet and exercise and we can recheck in a year. His liver numbers were up. I think this is probably due to being overweight, but I would like to check an ultrasound to make sure there is nothing else that could be causing it. Please place order for RUQ [place] for elevated LFTs All of his other labs are NORMAL.  Algis Greenhouse. Jerline Pain, MD 05/21/2019 12:12 PM

## 2019-05-22 ENCOUNTER — Other Ambulatory Visit: Payer: Self-pay

## 2019-05-22 DIAGNOSIS — R7989 Other specified abnormal findings of blood chemistry: Secondary | ICD-10-CM

## 2019-05-22 MED ORDER — METFORMIN HCL ER 500 MG PO TB24: 500.0000 mg | ORAL_TABLET | Freq: Every day | ORAL | 1 refills | Status: DC

## 2019-05-25 ENCOUNTER — Other Ambulatory Visit: Payer: Self-pay | Admitting: Family Medicine

## 2019-05-25 DIAGNOSIS — R7989 Other specified abnormal findings of blood chemistry: Secondary | ICD-10-CM

## 2019-06-01 ENCOUNTER — Ambulatory Visit
Admission: RE | Admit: 2019-06-01 | Discharge: 2019-06-01 | Disposition: A | Discharge status: Home / Self Care | Payer: BC Managed Care – PPO | Source: Ambulatory Visit | Attending: Family Medicine | Admitting: Family Medicine

## 2019-06-01 DIAGNOSIS — R7989 Other specified abnormal findings of blood chemistry: Secondary | ICD-10-CM

## 2019-06-01 NOTE — Progress Notes (Signed)
Please inform patient of the following:  His ultrasound showed fatty liver disease. Do not need to do any further testing at this point. Recommend that he continue to work on weightloss with diet and exercise and we can recheck blood work at his next office visit.  Jonathon Murphy. Jerline Pain, MD 06/01/2019 2:10 PM

## 2019-07-22 ENCOUNTER — Encounter: Payer: Self-pay | Admitting: Family Medicine

## 2020-04-12 ENCOUNTER — Emergency Department (HOSPITAL_COMMUNITY)
Admission: EM | Admit: 2020-04-12 | Discharge: 2020-04-12 | Disposition: A | Discharge status: Home / Self Care | Payer: No Typology Code available for payment source | Attending: Emergency Medicine | Admitting: Emergency Medicine

## 2020-04-12 ENCOUNTER — Other Ambulatory Visit: Payer: Self-pay

## 2020-04-12 ENCOUNTER — Emergency Department (HOSPITAL_COMMUNITY): Payer: No Typology Code available for payment source

## 2020-04-12 DIAGNOSIS — M25561 Pain in right knee: Secondary | ICD-10-CM | POA: Diagnosis present

## 2020-04-12 DIAGNOSIS — J45909 Unspecified asthma, uncomplicated: Secondary | ICD-10-CM | POA: Insufficient documentation

## 2020-04-12 DIAGNOSIS — Z91040 Latex allergy status: Secondary | ICD-10-CM | POA: Insufficient documentation

## 2020-04-12 DIAGNOSIS — Z79899 Other long term (current) drug therapy: Secondary | ICD-10-CM | POA: Insufficient documentation

## 2020-04-12 MED ORDER — IBUPROFEN 800 MG PO TABS
800.0000 mg | ORAL_TABLET | Freq: Once | ORAL | Status: AC
  Administered 2020-04-12: 800 mg via ORAL
  Filled 2020-04-12: qty 1

## 2020-04-12 NOTE — ED Notes (Signed)
Ortho tech called 
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
## 2020-04-12 NOTE — Discharge Instructions (Addendum)
Your x-rays did not show any acute abnormalities. You can wear the knee immobilizer and use crutches as needed. Weight bear as tolerated. Take ibuprofen as needed for the pain. Ice for the next 24 hours. Keep leg elevated when you are off your feet. If you are still having persistent pain that is limiting your mobility beyond this weekend then you need to make an appointment with orthopedic surgery.
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
## 2020-04-13 ENCOUNTER — Encounter: Payer: Self-pay | Admitting: Family Medicine

## 2020-04-13 ENCOUNTER — Telehealth (INDEPENDENT_AMBULATORY_CARE_PROVIDER_SITE_OTHER): Payer: BC Managed Care – PPO | Admitting: Family Medicine

## 2020-04-13 VITALS — Temp 97.6°F | Ht 69.0 in | Wt 255.0 lb

## 2020-04-13 DIAGNOSIS — M25561 Pain in right knee: Secondary | ICD-10-CM

## 2020-04-13 MED ORDER — DICLOFENAC SODIUM 75 MG PO TBEC: 75.0000 mg | DELAYED_RELEASE_TABLET | Freq: Two times a day (BID) | ORAL | 0 refills | Status: DC

## 2020-04-13 MED ORDER — ACETAMINOPHEN-CODEINE #3 300-30 MG PO TABS: 1.0000 | ORAL_TABLET | ORAL | 0 refills | Status: DC | PRN
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
## 2020-04-14 ENCOUNTER — Ambulatory Visit: Payer: Self-pay

## 2020-04-14 ENCOUNTER — Encounter: Payer: Self-pay | Admitting: Physician Assistant

## 2020-04-14 ENCOUNTER — Ambulatory Visit (INDEPENDENT_AMBULATORY_CARE_PROVIDER_SITE_OTHER): Payer: Worker's Compensation | Admitting: Orthopaedic Surgery

## 2020-04-14 DIAGNOSIS — M25572 Pain in left ankle and joints of left foot: Secondary | ICD-10-CM

## 2020-04-14 DIAGNOSIS — M25561 Pain in right knee: Secondary | ICD-10-CM | POA: Diagnosis not present
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
## 2020-04-15 ENCOUNTER — Telehealth: Payer: Self-pay | Admitting: Family Medicine

## 2020-04-15 NOTE — Telephone Encounter (Signed)
Patient is calling regarding workers comp paperwork. He would like to know where its in the process. He stated he cant get his MRI until he has this documentation. Please callwhen it is ready for pick. Thank you !

## 2020-04-19 NOTE — Telephone Encounter (Signed)
Pt aware form ready to be pick up

## 2020-04-22 ENCOUNTER — Telehealth: Payer: Self-pay | Admitting: Orthopaedic Surgery

## 2020-04-22 NOTE — Telephone Encounter (Signed)
Note written patient aware  

## 2020-04-22 NOTE — Telephone Encounter (Signed)
Pt would like to try and go back to work before his appt on 04/29/20 and if this is possible he would like for the note to be placed at the front desk and a CB when it's ready.  [phone]

## 2020-04-22 NOTE — Telephone Encounter (Signed)
Ok to do and ok to wrist restrictions if needed

## 2020-04-29 ENCOUNTER — Encounter: Payer: Self-pay | Admitting: Orthopaedic Surgery

## 2020-04-29 ENCOUNTER — Ambulatory Visit (INDEPENDENT_AMBULATORY_CARE_PROVIDER_SITE_OTHER): Payer: No Typology Code available for payment source | Admitting: Orthopaedic Surgery

## 2020-04-29 DIAGNOSIS — S82141D Displaced bicondylar fracture of right tibia, subsequent encounter for closed fracture with routine healing: Secondary | ICD-10-CM | POA: Diagnosis not present

## 2020-04-29 MED ORDER — ACETAMINOPHEN-CODEINE #3 300-30 MG PO TABS: 1.0000 | ORAL_TABLET | Freq: Three times a day (TID) | ORAL | 0 refills | Status: DC | PRN

## 2020-04-29 MED ORDER — IBUPROFEN 800 MG PO TABS: 800.0000 mg | ORAL_TABLET | Freq: Three times a day (TID) | ORAL | 0 refills | Status: DC | PRN

## 2020-04-29 NOTE — Progress Notes (Signed)
Office Visit Note   Patient: Jonathon Murphy           Date of Birth: 04-15-1986           MRN: [id number] Visit Date: 04/29/2020              Requested by: Ardith Dark, MD [address] [place],  [place] [address] PCP: Ardith Dark, MD   Assessment & Plan: Visit Diagnoses:  1. Closed fracture of right tibial plateau with routine healing, subsequent encounter     Plan: Impression is 2 weeks status post right knee lateral tibial plateau subchondral impaction fracture.  We will provide the patient with a hinged knee brace.  He may remain weightbearing as tolerated.  No ballistic activities.  We will provide him with a new work note for the next 4 weeks.  He will follow up with [place] at that point for recheck.  Call with concerns or questions in the meantime.  Follow-Up Instructions: Return in about 4 weeks (around 05/27/2020).   Orders:  No orders of the defined types were placed in this encounter.  Meds ordered this encounter  Medications  . ibuprofen (ADVIL) 800 MG tablet    Sig: Take 1 tablet (800 mg total) by mouth every 8 (eight) hours as needed.    Dispense:  30 tablet    Refill:  0  . acetaminophen-codeine (TYLENOL #3) 300-30 MG tablet    Sig: Take 1 tablet by mouth 3 (three) times daily as needed for moderate pain.    Dispense:  30 tablet    Refill:  0      Procedures: No procedures performed   Clinical Data: No additional findings.   Subjective: Chief Complaint  Patient presents with  . Right Knee - Pain    HPI patient is a pleasant 34 year old gentleman who comes in today to discuss MRI results of his right knee which occurred while at work.  This has been filed under [employer]  MRI results of the right knee show a small subchondral impaction fracture of the lateral tibial plateau.  The patient continues to endorse pain to the right knee, however this has improved over the past 2 weeks.  He is walking with a cane today.  He is still unable to  fully extend his knee and has increased pain with pivoting.  He has been taking ibuprofen during the day and Tylenol 3 at night which does seem to help.     Objective: Vital Signs: There were no vitals taken for this visit.    Ortho Exam stable exam of the right knee  Specialty Comments:  No specialty comments available.  Imaging: No new imaging   PMFS History: Patient Active Problem List   Diagnosis Date Noted  . Elevated LFTs 05/21/2019  . Hyperglycemia 05/21/2019  . Dyslipidemia 05/21/2019  . 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 Migraine 05/19/2019  . History of anaphylaxis 05/19/2019   Past Medical History:  Diagnosis Date  . Allergy   . Anxiety   . Asthma   . Concussion   . Depression   . Migraines     Family History  Problem Relation Age of Onset  . Arthritis Mother   . Miscarriages / [place] Mother   . Arthritis Father   . Diabetes Father   . Heart disease Father   . Arthritis Sister   . Depression Sister   . Arthritis Maternal  Grandmother   . Diabetes Maternal Grandmother   . Cancer Maternal Grandfather   . Heart disease Paternal Grandmother   . Cancer Paternal Grandfather   . Drug abuse Paternal Grandfather     Past Surgical History:  Procedure Laterality Date  . ADENOIDECTOMY    . APPENDECTOMY    . LAPAROSCOPIC APPENDECTOMY N/A 06/05/2017   Procedure: APPENDECTOMY LAPAROSCOPIC;  Surgeon: Gaynelle Adu, MD;  Location: [facility];  Service: General;  Laterality: N/A;   Social History   Occupational History  . Not on file  Tobacco Use  . Smoking status: Never Smoker  . Smokeless tobacco: Never Used  Vaping Use  . Vaping Use: Never used  Substance and Sexual Activity  . Alcohol use: Yes    Alcohol/week: 0.0 standard drinks    Comment: Occ  . Drug use: No  . Sexual activity: Not on file

## 2020-05-31 ENCOUNTER — Encounter: Payer: Self-pay | Admitting: Orthopaedic Surgery

## 2020-05-31 ENCOUNTER — Ambulatory Visit (INDEPENDENT_AMBULATORY_CARE_PROVIDER_SITE_OTHER): Payer: No Typology Code available for payment source | Admitting: Orthopaedic Surgery

## 2020-05-31 DIAGNOSIS — S82141D Displaced bicondylar fracture of right tibia, subsequent encounter for closed fracture with routine healing: Secondary | ICD-10-CM | POA: Diagnosis not present

## 2020-05-31 MED ORDER — IBUPROFEN 800 MG PO TABS: 800.0000 mg | ORAL_TABLET | Freq: Three times a day (TID) | ORAL | 2 refills | Status: AC | PRN

## 2020-05-31 NOTE — Progress Notes (Signed)
Office Visit Note   Patient: Jonathon Murphy           Date of Birth: [birth year]           MRN: [id number] Visit Date: 05/31/2020              Requested by: Ardith Dark, MD [address] [place],  [place] [address] PCP: Ardith Dark, MD   Assessment & Plan: Visit Diagnoses:  1. Closed fracture of right tibial plateau with routine healing, subsequent encounter     Plan: Impression is healed right knee subchondral impaction fracture. At this point he wishes to be released back to work without restrictions. Ibuprofen refilled.  Follow-Up Instructions: Return if symptoms worsen or fail to improve.   Orders:  No orders of the defined types were placed in this encounter.  Meds ordered this encounter  Medications  . ibuprofen (ADVIL) 800 MG tablet    Sig: Take 1 tablet (800 mg total) by mouth every 8 (eight) hours as needed.    Dispense:  30 tablet    Refill:  2      Procedures: No procedures performed   Clinical Data: No additional findings.   Subjective: Chief Complaint  Patient presents with  . Right Knee - Routine Post Jonathon Murphy returns today for his right knee injury. He states that he is doing much better. He is about 6 weeks from the injury. He is taking ibuprofen for which she needs a refill.   Review of Systems   Objective: Vital Signs: There were no vitals taken for this visit.  Physical Exam  Ortho Exam Right knee shows no tenderness palpation. Good range of motion. No swelling. Specialty Comments:  No specialty comments available.  Imaging: No results found.   PMFS History: Patient Active Problem List   Diagnosis Date Noted  . Elevated LFTs 05/21/2019  . Hyperglycemia 05/21/2019  . Dyslipidemia 05/21/2019  . 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 Migraine 05/19/2019  . History of anaphylaxis 05/19/2019   Past Medical History:  Diagnosis Date  . Allergy     . Anxiety   . Asthma   . Concussion   . Depression   . Migraines     Family History  Problem Relation Age of Onset  . Arthritis Mother   . Miscarriages / [place] Mother   . Arthritis Father   . Diabetes Father   . Heart disease Father   . Arthritis Sister   . Depression Sister   . Arthritis Maternal Grandmother   . Diabetes Maternal Grandmother   . Cancer Maternal Grandfather   . Heart disease Paternal Grandmother   . Cancer Paternal Grandfather   . Drug abuse Paternal Grandfather     Past Surgical History:  Procedure Laterality Date  . ADENOIDECTOMY    . APPENDECTOMY    . LAPAROSCOPIC APPENDECTOMY N/A 06/05/2017   Procedure: APPENDECTOMY LAPAROSCOPIC;  Surgeon: Gaynelle Adu, MD;  Location: [facility];  Service: General;  Laterality: N/A;   Social History   Occupational History  . Not on file  Tobacco Use  . Smoking status: Never Smoker  . Smokeless tobacco: Never Used  Vaping Use  . Vaping Use: Never used  Substance and Sexual Activity  . Alcohol use: Yes    Alcohol/week: 0.0 standard drinks    Comment: Occ  . Drug use: No  . Sexual activity: Not on file

## 2021-01-05 ENCOUNTER — Encounter: Payer: Self-pay | Admitting: Family Medicine

## 2021-01-05 ENCOUNTER — Telehealth (INDEPENDENT_AMBULATORY_CARE_PROVIDER_SITE_OTHER): Payer: BC Managed Care – PPO | Admitting: Family Medicine

## 2021-01-05 DIAGNOSIS — J4520 Mild intermittent asthma, uncomplicated: Secondary | ICD-10-CM

## 2021-01-05 DIAGNOSIS — G4730 Sleep apnea, unspecified: Secondary | ICD-10-CM

## 2021-01-05 DIAGNOSIS — R739 Hyperglycemia, unspecified: Secondary | ICD-10-CM

## 2021-01-05 DIAGNOSIS — G4733 Obstructive sleep apnea (adult) (pediatric): Secondary | ICD-10-CM | POA: Insufficient documentation

## 2021-01-05 DIAGNOSIS — R059 Cough, unspecified: Secondary | ICD-10-CM | POA: Diagnosis not present

## 2021-01-05 MED ORDER — AZITHROMYCIN 250 MG PO TABS: ORAL_TABLET | ORAL | 0 refills | Status: DC

## 2021-01-05 MED ORDER — METFORMIN HCL ER 500 MG PO TB24: 500.0000 mg | ORAL_TABLET | Freq: Every day | ORAL | 1 refills | Status: DC

## 2021-01-05 MED ORDER — PREDNISONE 50 MG PO TABS: ORAL_TABLET | ORAL | 0 refills | Status: DC

## 2021-01-05 NOTE — Assessment & Plan Note (Signed)
We will restart metformin 500 mg daily.  Advised him to come in soon for CPE and we can recheck A1c at that time.

## 2021-01-05 NOTE — Progress Notes (Signed)
   Jonathon Murphy is a 35 y.o. male who presents today for a virtual office visit.  Assessment/Plan:  Chronic Problems Addressed Today: Sleep-disordered breathing Wife notes that he snores frequently.  Sleep does not feel refreshing.  Has woken up several times in the middle of the night with difficulty breathing.  We will place referral for sleep study to rule out sleep apnea per patient request.  Hyperglycemia We will restart metformin 500 mg daily.  Advised him to come in soon for CPE and we can recheck A1c at that time.  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  Will start prednisone and azithromycin.  Encourage good oral hydration.  He can continue over-the-counter meds as needed.     Subjective:  HPI:  Patient's symptoms started about a week ago. Symptoms include rhinorrhea, cough, sputum production. No fevers or chills. Home covid test was negative.  Tried several OTC meds with modest improvement.  Symptoms are getting worse.  He has history of mono.  Has lung scarring due to low pneumonia in the past.  Similar symptoms in the past and resolved with prednisone and azithromycin.       Objective/Observations  Physical Exam: Gen: NAD, resting comfortably Pulm: Normal work of breathing Neuro: Grossly normal, moves all extremities Psych: Normal affect and thought content  Virtual Visit via Video   I connected with Laqueta Linden on 01/05/21 at 10:40 AM EDT by a video enabled telemedicine application and verified that I am speaking with the correct person using two identifiers. The limitations of evaluation and management by telemedicine and the availability of in person appointments were discussed. The patient expressed understanding and agreed to proceed.   Patient location: Private Office Provider location: [place] [facility] [employer] Persons participating in the virtual visit: Myself and Patient     Katina Degree. Jimmey Ralph,  MD 01/05/2021 10:45 AM

## 2021-01-05 NOTE — Assessment & Plan Note (Signed)
Wife notes that he snores frequently.  Sleep does not feel refreshing.  Has woken up several times in the middle of the night with difficulty breathing.  We will place referral for sleep study to rule out sleep apnea per patient request.

## 2021-01-05 NOTE — Assessment & Plan Note (Signed)
Flared due to recent URI.  Will start prednisone and azithromycin.  Encourage good oral hydration.  He can continue over-the-counter meds as needed.

## 2021-03-05 ENCOUNTER — Other Ambulatory Visit: Payer: Self-pay | Admitting: Family Medicine

## 2021-03-22 ENCOUNTER — Ambulatory Visit: Payer: BC Managed Care – PPO | Admitting: Neurology

## 2021-03-22 ENCOUNTER — Encounter: Payer: Self-pay | Admitting: Neurology

## 2021-03-22 VITALS — BP 144/82 | HR 89 | Ht 70.0 in | Wt 252.0 lb

## 2021-03-22 DIAGNOSIS — R0683 Snoring: Secondary | ICD-10-CM | POA: Diagnosis not present

## 2021-03-22 DIAGNOSIS — R0681 Apnea, not elsewhere classified: Secondary | ICD-10-CM

## 2021-03-22 DIAGNOSIS — R351 Nocturia: Secondary | ICD-10-CM

## 2021-03-22 DIAGNOSIS — G478 Other sleep disorders: Secondary | ICD-10-CM

## 2021-03-22 DIAGNOSIS — G4719 Other hypersomnia: Secondary | ICD-10-CM

## 2021-03-22 DIAGNOSIS — Z820 Family history of epilepsy and other diseases of the nervous system: Secondary | ICD-10-CM

## 2021-03-22 DIAGNOSIS — E669 Obesity, unspecified: Secondary | ICD-10-CM

## 2021-03-22 NOTE — Patient Instructions (Signed)
It was nice to meet you today. Based on your symptoms and your exam I believe you are at risk for obstructive sleep apnea (aka OSA), and I think we should proceed with a sleep study to determine whether you do or do not have OSA and how severe it is. Even, if you have mild OSA, I may want you to consider treatment with CPAP, as treatment of even borderline or mild sleep apnea can result and improvement of symptoms such as sleep disruption, daytime sleepiness, nighttime bathroom breaks, restless leg symptoms, improvement of headache syndromes, even improved mood disorder.   As explained, an attended sleep study meaning you get to stay overnight in the sleep lab, lets us monitor sleep-related behaviors such as sleep talking and leg movements in sleep, in addition to monitoring for sleep apnea.  A home sleep test is a screening tool for sleep apnea only, and unfortunately does not help with any other sleep-related diagnoses.  Please remember, the long-term risks and ramifications of untreated moderate to severe obstructive sleep apnea are: increased Cardiovascular disease, including congestive heart failure, stroke, difficult to control hypertension, treatment resistant obesity, arrhythmias, especially irregular heartbeat commonly known as A. Fib. (atrial fibrillation); even type 2 diabetes has been linked to untreated OSA.   Sleep apnea can cause disruption of sleep and sleep deprivation in most cases, which, in turn, can cause recurrent headaches, problems with memory, mood, concentration, focus, and vigilance. Most people with untreated sleep apnea report excessive daytime sleepiness, which can affect their ability to drive. Please do not drive if you feel sleepy. Patients with sleep apnea can also develop difficulty initiating and maintaining sleep (aka insomnia).   Having sleep apnea may increase your risk for other sleep disorders, including involuntary behaviors sleep such as sleep terrors, sleep  talking, sleepwalking.    Having sleep apnea can also increase your risk for restless leg syndrome and leg movements at night.   Please note that untreated obstructive sleep apnea may carry additional perioperative morbidity. Patients with significant obstructive sleep apnea (typically, in the moderate to severe degree) should receive, if possible, perioperative PAP (positive airway pressure) therapy and the surgeons and particularly the anesthesiologists should be informed of the diagnosis and the severity of the sleep disordered breathing.   I will likely see you back after your sleep study to go over the test results and where to go from there. We will call you after your sleep study to advise about the results (most likely, you will hear from Megan, my nurse) and to set up an appointment at the time, as necessary.    Our sleep lab administrative assistant will call you to schedule your sleep study and give you further instructions, regarding the check in process for the sleep study, arrival time, what to bring, when you can expect to leave after the study, etc., and to answer any other logistical questions you may have. If you don't hear back from her by about 2 weeks from now, please feel free to call her direct line at [phone] or you can call our general clinic number, or email us through My Chart.    

## 2021-03-22 NOTE — Progress Notes (Signed)
Subjective:    Patient ID: Jonathon Murphy is a 35 y.o. male.  HPI    Huston FoleySaima Dametra Whetsel, MD, PhD [facility] [address], [address] [address] [place], [place] [id number]  Dear Dr. Jimmey RalphParker,   I saw your patient, Jonathon Murphy, upon your kind request in my sleep clinic today for initial consultation of his sleep disorder, in particular, concern for underlying obstructive sleep apnea.  The patient is unaccompanied today.  As you know, Mr. Jonathon Murphy is a 35 year old right-handed gentleman with an underlying medical history of asthma, allergies, prediabetes, depression, anxiety, and obesity, who reports snoring and excessive daytime somnolence as well as witnessed apneas per wife's report.  I reviewed your office note from 01/05/2021.  His Epworth sleepiness score is 10 out of 24, fatigue severity score is 48 out of 63.  He lives with his wife and 9753-month-old daughter.  They have 2 cats in the household, their dog is currently housed with his parents. He works for the [employer].  He is a non-smoker and drinks alcohol occasionally, once every 2 weeks or so, significant caffeine in the form of sodas, about 4 servings per day on average.  He is trying to reduce his sodium intake and working on weight loss, thus far has lost about 10 pounds.  Both parents have sleep apnea, father has a CPAP machine.  Patient had a tonsillectomy and adenoidectomy at age 35.  Bedtime is generally around 1030 or 11 PM and rise time between 6 and 6:30 AM.  He does not wake up rested.  He has woken up with a sense of gasping for air.  Snoring and apneas are better when he sleeps on the left side.  He often ends up sleeping on the couch or in the guest bedroom.  His Past Medical History Is Significant For: Past Medical History:  Diagnosis Date   Allergy    Anxiety    Asthma    Concussion    Depression    Migraines    Prediabetes    Scarring of lung     His Past Surgical History Is  Significant For: Past Surgical History:  Procedure Laterality Date   ADENOIDECTOMY     APPENDECTOMY     LAPAROSCOPIC APPENDECTOMY N/A 06/05/2017   Procedure: APPENDECTOMY LAPAROSCOPIC;  Surgeon: Gaynelle AduWilson, Eric, MD;  Location: [facility];  Service: General;  Laterality: N/A;   TONSILLECTOMY      His Family History Is Significant For: Family History  Problem Relation Age of Onset   Arthritis Mother    Miscarriages / [place] Mother    Sleep apnea Mother    Arthritis Father    Diabetes Father    Heart disease Father    Sleep apnea Father    Arthritis Sister    Depression Sister    Arthritis Maternal Grandmother    Diabetes Maternal Grandmother    Cancer Maternal Grandfather    Skin cancer Maternal Grandfather    Heart disease Paternal Grandmother    Cancer Paternal Grandfather    Drug abuse Paternal Grandfather    Diabetes Paternal Grandfather     His Social History Is Significant For: Social History   Socioeconomic History   Marital status: Married    Spouse name: Not on file   Number of children: 1   Years of education: Not on file   Highest education level: Bachelor's degree (e.g., BA, AB, BS)  Occupational History   Not on file  Tobacco Use  Smoking status: Never   Smokeless tobacco: Never  Vaping Use   Vaping Use: Never used  Substance and Sexual Activity   Alcohol use: Yes    Comment: 1 drink every 2 weeks if on the golf course   Drug use: Never   Sexual activity: Not on file  Other Topics Concern   Not on file  Social History Narrative   Lives at home with wife and daughter   Right handed   Caffeine: 4 sodas per day, each 16 oz    Social Determinants of Health   Financial Resource Strain: Not on file  Food Insecurity: Not on file  Transportation Needs: Not on file  Physical Activity: Not on file  Stress: Not on file  Social Connections: Not on file    His Allergies Are:  Allergies  Allergen Reactions   Bee Venom Anaphylaxis   Sulfa  Antibiotics Shortness Of Breath   Benadryl [Diphenhydramine] Hives   Erythromycin Other (See Comments)    Does not know   Other Itching and Swelling    Anything in the "Nut family"   Suprax [Cefixime] Other (See Comments)    Does not know     Latex Rash   Tape Rash    Paper tape is ok  :   His Current Medications Are:  Outpatient Encounter Medications as of 03/22/2021  Medication Sig   acetaminophen (TYLENOL) 500 MG tablet Take 500 mg by mouth every 6 (six) hours as needed.   albuterol (PROVENTIL HFA;VENTOLIN HFA) 108 (90 Base) MCG/ACT inhaler Inhale 2 puffs into the lungs every 6 (six) hours as needed for wheezing or shortness of breath (cough, shortness of breath or wheezing.).   diclofenac (VOLTAREN) 75 MG EC tablet Take 1 tablet (75 mg total) by mouth 2 (two) times daily.   EPINEPHrine 0.3 mg/0.3 mL IJ SOAJ injection Inject 0.3 mLs (0.3 mg total) into the muscle as needed.   ibuprofen (ADVIL) 800 MG tablet Take 1 tablet (800 mg total) by mouth every 8 (eight) hours as needed.   metFORMIN (GLUCOPHAGE-XR) 500 MG 24 hr tablet TAKE 1 TABLET BY MOUTH AT BEDTIME.   [DISCONTINUED] azithromycin (ZITHROMAX) 250 MG tablet Take 2 tabs day 1, then 1 tab daily (Patient not taking: Reported on 03/22/2021)   [DISCONTINUED] predniSONE (DELTASONE) 50 MG tablet Take 1 tablet daily for 5 days. (Patient not taking: Reported on 03/22/2021)   No facility-administered encounter medications on file as of 03/22/2021.  :   Review of Systems:  Out of a complete 14 point review of systems, all are reviewed and negative with the exception of these symptoms as listed below:   Review of Systems  Neurological:        Patient is here for a sleep consult. He endorses excessive snoring  and that he stops breathing at night. He also endorses sleepiness during the day after long period of inaction.   Epworth Sleepiness Scale 0= would never doze 1= slight chance of dozing 2= moderate chance of dozing 3= high  chance of dozing  Sitting and reading: 1 Watching TV: 2 Sitting inactive in a public place (ex. Theater or meeting): 0 As a passenger in a car for an hour without a break: 2 Lying down to rest in the afternoon: 3 Sitting and talking to someone: 0 Sitting quietly after lunch (no alcohol): 2 In a car, while stopped in traffic: 0 Total:10  FSS 48    Objective:  Neurological Exam  Physical Exam Physical Examination:  Vitals:   03/22/21 1237  BP: (!) 144/82  Pulse: 89    General Examination: The patient is a very pleasant 35 y.o. male in no acute distress. He appears well-developed and well-nourished and well groomed.   HEENT: Normocephalic, atraumatic, pupils are equal, round and reactive to light, extraocular tracking is good without limitation to gaze excursion or nystagmus noted.  Corrective lenses in place.  Hearing is grossly intact. Face is symmetric with normal facial animation. Speech is clear with no dysarthria noted. There is no hypophonia. There is no lip, neck/head, jaw or voice tremor. Neck is supple with full range of passive and active motion. There are no carotid bruits on auscultation. Oropharynx exam reveals: mild mouth dryness, good dental hygiene and moderate airway crowding, due to small airway entry.  He also has a small mouth opening, Mallampati class IV, neck circumference of 18-1/8 inches.  Uvula not fully visualized.  Tongue protrudes centrally.  He has a minimal overbite.  Nasal inspection reveals no significant septal deviation or inferior turbinate hypertrophy, narrow nasal passages noted.  Chest: Clear to auscultation without wheezing, rhonchi or crackles noted.  Heart: S1+S2+0, regular and normal without murmurs, rubs or gallops noted.   Abdomen: Soft, non-tender and non-distended with normal bowel sounds appreciated on auscultation.  Extremities: There is no pitting edema in the distal lower extremities bilaterally.   Skin: Warm and dry without  trophic changes noted.   Musculoskeletal: exam reveals no obvious joint deformities, tenderness or joint swelling or erythema.   Neurologically:  Mental status: The patient is awake, alert and oriented in all 4 spheres. His immediate and remote memory, attention, language skills and fund of knowledge are appropriate. There is no evidence of aphasia, agnosia, apraxia or anomia. Speech is clear with normal prosody and enunciation. Thought process is linear. Mood is normal and affect is normal.  Cranial nerves II - XII are as described above under HEENT exam.  Motor exam: Normal bulk, strength and tone is noted. There is no tremor, Romberg is negative. Reflexes are 2+ throughout. Fine motor skills and coordination: grossly intact.  Cerebellar testing: No dysmetria or intention tremor. There is no truncal or gait ataxia.  Sensory exam: intact to light touch in the upper and lower extremities.  Gait, station and balance: He stands easily. No veering to one side is noted. No leaning to one side is noted. Posture is age-appropriate and stance is narrow based. Gait shows normal stride length and normal pace. No problems turning are noted. Tandem walk is unremarkable.                Assessment and plan:   In summary, Sundiata Ferrick is a very pleasant 35 y.o.-year old male with an underlying medical history of asthma, allergies, prediabetes, depression, anxiety, and obesity, whose history and physical exam are concerning for obstructive sleep apnea (OSA). I had a long chat with the patient about my findings and the diagnosis of OSA, its prognosis and treatment options. We talked about medical treatments, surgical interventions and non-pharmacological approaches. I explained in particular the risks and ramifications of untreated moderate to severe OSA, especially with respect to developing cardiovascular disease down the Road, including congestive heart failure, difficult to treat hypertension, cardiac  arrhythmias, or stroke. Even type 2 diabetes has, in part, been linked to untreated OSA. Symptoms of untreated OSA include daytime sleepiness, memory problems, mood irritability and mood disorder such as depression and anxiety, lack of energy, as well as recurrent headaches, especially  morning headaches. We talked about trying to maintain a healthy lifestyle in general, as well as the importance of weight control. We also talked about the importance of good sleep hygiene. I recommended the following at this time: sleep study.  I explained to him the difference between a laboratory attended sleep study versus home sleep test.  I outlined possible surgical and non-surgical treatment options of OSA, including the use of a custom-made dental device (which would require a referral to a specialist dentist or oral surgeon), upper airway surgical options, such as traditional UPPP or a novel less invasive surgical option in the form of Inspire hypoglossal nerve stimulation (which would involve a referral to an ENT surgeon). I also explained the CPAP treatment option to the patient, who indicated that he would be willing to try CPAP if the need arises. I explained the importance of being compliant with PAP treatment, not only for insurance purposes but primarily to improve His symptoms, and for the patient's long term health benefit, including to reduce His cardiovascular risks. I answered all his questions today and the patient was in agreement. I plan to see him back after the sleep study is completed and encouraged him to call with any interim questions, concerns, problems or updates.   Thank you very much for allowing me to participate in the care of this nice patient. If I can be of any further assistance to you please do not hesitate to call me at [phone].  Sincerely,   Huston Foley, MD, PhD

## 2021-03-27 ENCOUNTER — Telehealth: Payer: Self-pay

## 2021-03-27 NOTE — Telephone Encounter (Signed)
LVM for pt to call me back to schedule sleep study  

## 2021-03-28 ENCOUNTER — Telehealth (INDEPENDENT_AMBULATORY_CARE_PROVIDER_SITE_OTHER): Payer: BC Managed Care – PPO | Admitting: Family Medicine

## 2021-03-28 ENCOUNTER — Encounter: Payer: Self-pay | Admitting: Family Medicine

## 2021-03-28 VITALS — Temp 98.0°F

## 2021-03-28 DIAGNOSIS — U071 COVID-19: Secondary | ICD-10-CM | POA: Diagnosis not present

## 2021-03-28 MED ORDER — NIRMATRELVIR/RITONAVIR (PAXLOVID)TABLET: 3.0000 | ORAL_TABLET | Freq: Two times a day (BID) | ORAL | 0 refills | Status: AC

## 2021-03-28 MED ORDER — ALBUTEROL SULFATE HFA 108 (90 BASE) MCG/ACT IN AERS: 2.0000 | INHALATION_SPRAY | Freq: Four times a day (QID) | RESPIRATORY_TRACT | 0 refills | Status: AC | PRN

## 2021-03-28 MED ORDER — BENZONATATE 100 MG PO CAPS: 100.0000 mg | ORAL_CAPSULE | Freq: Three times a day (TID) | ORAL | 0 refills | Status: DC | PRN

## 2021-03-28 NOTE — Patient Instructions (Addendum)
---------------------------------------------------------------------------------------------------------------------------    WORK SLIP:  Patient Jonathon Murphy,  1986-04-06, was seen for a medical visit today, 03/28/21 . Please excuse from work for a COVID like illness. We advise 10 days minimum from the onset of symptoms (03/25/21) PLUS 1 day of no fever and improved symptoms. Will defer to employer for a sooner return to work if symptoms have resolved, it is greater than 5 days since the positive test and the patient can wear a high-quality, tight fitting mask such as N95 or KN95 at all times for an additional 5 days. Would also suggest COVID19 antigen testing is negative prior to return.  Sincerely: E-signature: Dr. Colin Benton, DO [facility] Ph: [phone]   ------------------------------------------------------------------------------------------------------------------------------   HOME CARE TIPS:  -I sent the medication(s) we discussed to your pharmacy: Meds ordered this encounter  Medications   nirmatrelvir/ritonavir EUA (PAXLOVID) TABS    Sig: Take 3 tablets by mouth 2 (two) times daily for 5 days. (Take nirmatrelvir 150 mg two tablets twice daily for 5 days and ritonavir 100 mg one tablet twice daily for 5 days) Patient GFR is > 60 on labs at the end of March and patient denies any issues with his kidneys.    Dispense:  30 tablet    Refill:  0   benzonatate (TESSALON PERLES) 100 MG capsule    Sig: Take 1 capsule (100 mg total) by mouth 3 (three) times daily as needed.    Dispense:  20 capsule    Refill:  0   albuterol (VENTOLIN HFA) 108 (90 Base) MCG/ACT inhaler    Sig: Inhale 2 puffs into the lungs every 6 (six) hours as needed for wheezing or shortness of breath (cough, shortness of breath or wheezing.).    Dispense:  1 each    Refill:  0     -I sent in the [facility] treatment or referral you requested per our discussion. Please see the  information provided below and discuss further with the pharmacist/treatment team.  -If taking Paxlovid, please review all medications, supplement and over the counter drugs with your pharmacist and ask them to check for any interactions.   -If taking Paxlovid, there is a chance of rebound illness after finishing your treatment. If you become sick again please isolate for an additional 5 days.   -can use aleve if needed for fevers, aches and pains per instructions  -can use nasal saline a few times per day if you have nasal congestion; sometimes a short course of Afrin nasal spray for 3 days can help with symptoms as well  -stay hydrated, drink plenty of fluids and eat small healthy meals - avoid dairy  -If the Covid test is positive, check out the [facility] website for more information on home care, transmission and treatment for COVID19  -follow up with your doctor in 2-3 days unless improving and feeling better  -stay home while sick, except to seek medical care. If you have COVID19, ideally it would be best to stay home for a full 10 days since the onset of symptoms PLUS one day of no fever and feeling better. Wear a good mask that fits snugly (such as N95 or KN95) if around others to reduce the risk of transmission.  It was nice to meet you today, and I really hope you are feeling better soon. I help [place] out with telemedicine visits on Tuesdays and Thursdays and am available for visits on those days. If you have any  concerns or questions following this visit please schedule a follow up visit with your Primary Care doctor or seek care at a local urgent care clinic to avoid delays in care.    Seek in person care or schedule a follow up video visit promptly if your symptoms worsen, new concerns arise or you are not improving with treatment. Call 911 and/or seek emergency care if your symptoms are severe or life threatening.  FACT SHEET FOR PATIENTS, PARENTS, AND CAREGIVERS EMERGENCY USE  AUTHORIZATION (EUA) OF PAXLOVID FOR CORONAVIRUS DISEASE 2019 (COVID-19) You are being given this Fact Sheet because your healthcare provider believes it is necessary to provide you with PAXLOVID for the treatment of mild-to-moderate coronavirus disease (COVID-19) caused by the SARS-CoV-2 virus. This Fact Sheet contains information to help you understand the risks and benefits of taking the PAXLOVID you have received or may receive. The U.S. Food and Drug Administration (FDA) has issued an Emergency Use Authorization (EUA) to make PAXLOVID available during the COVID-19 pandemic (for more details about an EUA please see "What is an Emergency Use Authorization?" at the end of this document). PAXLOVID is not an FDA-approved medicine in the [place]. Read this Fact Sheet for information about PAXLOVID. Talk to your healthcare provider about your options or if you have any questions. It is your choice to take PAXLOVID.  What is COVID-19? COVID-19 is caused by a virus called a coronavirus. You can get COVID-19 through close contact with another person who has the virus. COVID-19 illnesses have ranged from very mild-to-severe, including illness resulting in death. While information so far suggests that most COVID-19 illness is mild, serious illness can happen and may cause some of your other medical conditions to become worse. Older people and people of all ages with severe, long lasting (chronic) medical conditions like heart disease, lung disease, and diabetes, for example seem to be at higher risk of being hospitalized for COVID-19.  What is PAXLOVID? PAXLOVID is an investigational medicine used to treat mild-to-moderate COVID-19 in adults and children [63 years of age and older weighing at least 31 pounds (73 kg)] with positive results of direct SARS-CoV-2 viral testing, and who are at high risk for progression to severe COVID-19, including hospitalization or death. PAXLOVID  is investigational because it is still being studied. There is limited information about the safety and effectiveness of using PAXLOVID to treat people with mild-to-moderate COVID-19.  The FDA has authorized the emergency use of PAXLOVID for the treatment of mild-tomoderate COVID-19 in adults and children [16 years of age and older weighing at least 57 pounds (28 kg)] with a positive test for the virus that causes COVID-19, and who are at high risk for progression to severe COVID-19, including hospitalization or death, under an EUA. 1 Revised: 11 November 2020   What should I tell my healthcare provider before I take PAXLOVID? Tell your healthcare provider if you: ? Have any allergies ? Have liver or kidney disease ? Are pregnant or plan to become pregnant ? Are breastfeeding a child ? Have any serious illnesses  Tell your healthcare provider about all the medicines you take, including prescription and over-the-counter medicines, vitamins, and herbal supplements. Some medicines may interact with PAXLOVID and may cause serious side effects. Keep a list of your medicines to show your healthcare provider and pharmacist when you get a new medicine.  You can ask your healthcare provider or pharmacist for a list of medicines that interact with PAXLOVID. Do not start taking a  new medicine without telling your healthcare provider. Your healthcare provider can tell you if it is safe to take PAXLOVID with other medicines.  Tell your healthcare provider if you are taking combined hormonal contraceptive. PAXLOVID may affect how your birth control pills work. Females who are able to become pregnant should use another effective alternative form of contraception or an additional barrier method of contraception. Talk to your healthcare provider if you have any questions about contraceptive methods that might be right for you.  How do I take PAXLOVID? ? PAXLOVID consists of 2 medicines: nirmatrelvir  and ritonavir. o Take 2 pink tablets of nirmatrelvir with 1 white tablet of ritonavir by mouth 2 times each day (in the morning and in the evening) for 5 days. For each dose, take all 3 tablets at the same time. o If you have kidney disease, talk to your healthcare provider. You may need a different dose. ? Swallow the tablets whole. Do not chew, break, or crush the tablets. ? Take PAXLOVID with or without food. ? Do not stop taking PAXLOVID without talking to your healthcare provider, even if you feel better. ? If you miss a dose of PAXLOVID within 8 hours of the time it is usually taken, take it as soon as you remember. If you miss a dose by more than 8 hours, skip the missed dose and take the next dose at your regular time. Do not take 2 doses of PAXLOVID at the same time. ? If you take too much PAXLOVID, call your healthcare provider or go to the nearest hospital emergency room right away. ? If you are taking a ritonavir- or cobicistat-containing medicine to treat hepatitis C or Human Immunodeficiency Virus (HIV), you should continue to take your medicine as prescribed by your healthcare provider. 2 Revised: 11 November 2020    Talk to your healthcare provider if you do not feel better or if you feel worse after 5 days.  Who should generally not take PAXLOVID? Do not take PAXLOVID if: ? You are allergic to nirmatrelvir, ritonavir, or any of the ingredients in PAXLOVID. ? You are taking any of the following medicines: o Alfuzosin o Pethidine, propoxyphene o Ranolazine o Amiodarone, dronedarone, flecainide, propafenone, quinidine o Colchicine o Lurasidone, pimozide, clozapine o Dihydroergotamine, ergotamine, methylergonovine o Lovastatin, simvastatin o Sildenafil (Revatio) for pulmonary arterial hypertension (PAH) o Triazolam, oral midazolam o Apalutamide o Carbamazepine, phenobarbital, phenytoin o Rifampin o St. John's Wort (hypericum perforatum) Taking PAXLOVID with these  medicines may cause serious or life-threatening side effects or affect how PAXLOVID works.  These are not the only medicines that may cause serious side effects if taken with PAXLOVID. PAXLOVID may increase or decrease the levels of multiple other medicines. It is very important to tell your healthcare provider about all of the medicines you are taking because additional laboratory tests or changes in the dose of your other medicines may be necessary while you are taking PAXLOVID. Your healthcare provider may also tell you about specific symptoms to watch out for that may indicate that you need to stop or decrease the dose of some of your other medicines.  What are the important possible side effects of PAXLOVID? Possible side effects of PAXLOVID are: ? Allergic Reactions. Allergic reactions can happen in people taking PAXLOVID, even after only 1 dose. Stop taking PAXLOVID and call your healthcare provider right away if you get any of the following symptoms of an allergic reaction: o hives o trouble swallowing or breathing o swelling  of the mouth, lips, or face o throat tightness o hoarseness 3 Revised: 11 November 2020  o skin rash ? Liver Problems. Tell your healthcare provider right away if you have any of these signs and symptoms of liver problems: loss of appetite, yellowing of your skin and the whites of eyes (jaundice), dark-colored urine, pale colored stools and itchy skin, stomach area (abdominal) pain. ? Resistance to HIV Medicines. If you have untreated HIV infection, PAXLOVID may lead to some HIV medicines not working as well in the future. ? Other possible side effects include: o altered sense of taste o diarrhea o high blood pressure o muscle aches These are not all the possible side effects of PAXLOVID. Not many people have taken PAXLOVID. Serious and unexpected side effects may happen. PAXLOVID is still being studied, so it is possible that all of the risks are not  known at this time.  What other treatment choices are there? Veklury (remdesivir) is FDA-approved for the treatment of mild-to-moderate ESPQZ-30 in certain adults and children. Talk with your doctor to see if Marijean Heath is appropriate for you. Like PAXLOVID, FDA may also allow for the emergency use of other medicines to treat people with COVID-19. Go to https://price.info/ for information on the emergency use of other medicines that are authorized by FDA to treat people with COVID-19. Your healthcare provider may talk with you about clinical trials for which you may be eligible. It is your choice to be treated or not to be treated with PAXLOVID. Should you decide not to receive it or for your child not to receive it, it will not change your standard medical care.  What if I am pregnant or breastfeeding? There is no experience treating pregnant women or breastfeeding mothers with PAXLOVID. For a mother and unborn baby, the benefit of taking PAXLOVID may be greater than the risk from the treatment. If you are pregnant, discuss your options and specific situation with your healthcare provider. It is recommended that you use effective barrier contraception or do not have sexual activity while taking PAXLOVID. If you are breastfeeding, discuss your options and specific situation with your healthcare provider. 4 Revised: 11 November 2020   How do I report side effects with PAXLOVID? Contact your healthcare provider if you have any side effects that bother you or do not go away. Report side effects to FDA MedWatch at SmoothHits.hu or call 1-800-FDA1088 or you can report side effects to [employer]. at the contact information provided below. Website Fax number Telephone number www.pfizersafetyreporting.com ([phone] How should I store [place]? Store PAXLOVID tablets  at room temperature, between 68?F to 77?F (20?C to 25?C). How can I learn more about COVID-19? ? Ask your healthcare provider. ? Visit [email or web address]-johnson.com/. ? Contact your local or state public health department. What is an Emergency Use Authorization (EUA)? The [place] FDA has made PAXLOVID available under an emergency access mechanism called an Emergency Use Authorization (EUA). The EUA is supported by a Education officer, museum and Human Service (HHS) declaration that circumstances exist to justify the emergency use of drugs and biological products during the COVID-19 pandemic. PAXLOVID for the treatment of mild-to-moderate COVID-19 in adults and children [3 years of age and older weighing at least 28 pounds (25 kg)] with positive results of direct SARS-CoV-2 viral testing, and who are at high risk for progression to severe COVID-19, including hospitalization or death, has not undergone the same type of review as an FDA-approved product. In issuing an EUA under the  DXIPJ-82 public health emergency, the FDA has determined, among other things, that based on the total amount of scientific evidence available including data from adequate and well-controlled clinical trials, if available, it is reasonable to believe that the product may be effective for diagnosing, treating, or preventing COVID-19, or a serious or life-threatening disease or condition caused by COVID-19; that the known and potential benefits of the product, when used to diagnose, treat, or prevent such disease or condition, outweigh the known and potential risks of such product; and that there are no adequate, approved, and available alternatives. All of these criteria must be met to allow for the product to be used in the treatment of patients during the COVID-19 pandemic. The EUA for PAXLOVID is in effect for the duration of the COVID-19 declaration justifying emergency use of this product, unless terminated or  revoked (after which the products may no longer be used under the EUA). 5 Revised: 11 November 2020     Additional Information For general questions, visit the website or call the telephone number provided below. Website Telephone number www.COVID19oralRx.com [phone] (1-877-C19-PACK) You can also go to www.pfizermedinfo.com or call [phone] for more information. [id number] Revised: 11 November 2020

## 2021-03-28 NOTE — Progress Notes (Signed)
Virtual Visit via Video Note  I connected with Jonathon Murphy  on 03/28/21 at  3:20 PM EDT by a video enabled telemedicine application and verified that I am speaking with the correct person using two identifiers.  Location patient: home, [place] Location provider:work or home office Persons participating in the virtual visit: patient, provider, daughter is with him today  I discussed the limitations of evaluation and management by telemedicine and the availability of in person appointments. The patient expressed understanding and agreed to proceed.   HPI:  Acute telemedicine visit for Covid19: -Onset: about 3 days ago; tested positive for covid yesterday on home test -Symptoms include: nasal congestion, cough, fever, had some sob (feels like has to breath through his mouth because his nose is congestion) and dizziness - now improving today, loss of taste -Denies: CP, SOB currently, NVD, inability to drink/eat/get out of bed -Pertinent past medical history: see below - has asthma but has not required this illness, does need refill on his albuterol; asthma -Pertinent medication allergies: Allergies  Allergen Reactions   Bee Venom Anaphylaxis   Sulfa Antibiotics Shortness Of Breath   Benadryl [Diphenhydramine] Hives   Erythromycin Other (See Comments)    Does not know   Other Itching and Swelling    Anything in the "Nut family"   Suprax [Cefixime] Other (See Comments)    Does not know     Latex Rash   Tape Rash    Paper tape is ok  -COVID-19 vaccine status: 2 doses and booster in March of pfizer -had labs in March with Cr 83, mildly elevated LFTs but reports has work up and was told does not have liver disease (reports his whole family has this issues)  ROS: See pertinent positives and negatives per HPI.  Past Medical History:  Diagnosis Date   Allergy    Anxiety    Asthma    Concussion    Depression    Migraines    Prediabetes    Scarring of lung     Past Surgical History:   Procedure Laterality Date   ADENOIDECTOMY     APPENDECTOMY     LAPAROSCOPIC APPENDECTOMY N/A 06/05/2017   Procedure: APPENDECTOMY LAPAROSCOPIC;  Surgeon: Gaynelle Adu, MD;  Location: [facility];  Service: General;  Laterality: N/A;   TONSILLECTOMY       Current Outpatient Medications:    acetaminophen (TYLENOL) 500 MG tablet, Take 500 mg by mouth every 6 (six) hours as needed., Disp: , Rfl:    benzonatate (TESSALON PERLES) 100 MG capsule, Take 1 capsule (100 mg total) by mouth 3 (three) times daily as needed., Disp: 20 capsule, Rfl: 0   EPINEPHrine 0.3 mg/0.3 mL IJ SOAJ injection, Inject 0.3 mLs (0.3 mg total) into the muscle as needed., Disp: 2 each, Rfl: 0   ibuprofen (ADVIL) 800 MG tablet, Take 1 tablet (800 mg total) by mouth every 8 (eight) hours as needed., Disp: 30 tablet, Rfl: 2   metFORMIN (GLUCOPHAGE-XR) 500 MG 24 hr tablet, TAKE 1 TABLET BY MOUTH AT BEDTIME., Disp: 30 tablet, Rfl: 1   nirmatrelvir/ritonavir EUA (PAXLOVID) TABS, Take 3 tablets by mouth 2 (two) times daily for 5 days. (Take nirmatrelvir 150 mg two tablets twice daily for 5 days and ritonavir 100 mg one tablet twice daily for 5 days) Patient GFR is > 60 on labs at the end of March and patient denies any issues with his kidneys., Disp: 30 tablet, Rfl: 0   albuterol (VENTOLIN HFA) 108 (90 Base) MCG/ACT inhaler,  Inhale 2 puffs into the lungs every 6 (six) hours as needed for wheezing or shortness of breath (cough, shortness of breath or wheezing.)., Disp: 1 each, Rfl: 0  EXAM:  VITALS per patient if applicable:  GENERAL: alert, oriented, appears well and in no acute distress  HEENT: atraumatic, conjunttiva clear, no obvious abnormalities on inspection of external nose and ears  NECK: normal movements of the head and neck  LUNGS: on inspection no signs of respiratory distress, breathing rate appears normal, no obvious gross SOB, gasping or wheezing  CV: no obvious cyanosis  MS: moves all visible extremities without  noticeable abnormality  PSYCH/NEURO: pleasant and cooperative, no obvious depression or anxiety, speech and thought processing grossly intact  ASSESSMENT AND PLAN:  Discussed the following assessment and plan:  COVID-19   Discussed treatment options (infusions and oral options and risk of drug interactions), ideal treatment window, potential complications, isolation and precautions for COVID-19.  Discussed possibility of rebound with antivirals and the need to reisolate if it should occur for 5 days. Checked for/reviewed any labs done in the last 90 days with GFR listed in HPI if available. After lengthy discussion, the patient opted for treatment with Paxlovid due to being higher risk for complications of covid or severe disease and other factors. He reports his kideney function has always been good and looked good on labs in late march. Discussed EUA status of this drug and the fact that there is preliminary limited knowledge of risks/interactions/side effects per EUA document vs possible benefits and precautions. This information was shared with patient during the visit and also was provided in patient instructions. Also, advised that patient discuss risks/interactions and use with pharmacist/treatment team as well. . The patient did want a prescription for cough, Tessalon Rx sent.  Refilled his albuterol in case he needs it. Other symptomatic care measures summarized in patient instructions. Work/School slipped offered: provided in patient instructions   Advised to seek prompt in person care if worsening, new symptoms arise, or if is not improving with treatment. Discussed options for inperson care if PCP office not available. Did let this patient know that I only do telemedicine on Tuesdays and Thursdays for [employer]. Advised to schedule follow up visit with PCP or [facility] if any further questions or concerns to avoid delays in care.   I discussed the assessment and treatment plan with the patient. The  patient was provided an opportunity to ask questions and all were answered. The patient agreed with the plan and demonstrated an understanding of the instructions.     Terressa Koyanagi, DO

## 2021-04-04 ENCOUNTER — Telehealth (INDEPENDENT_AMBULATORY_CARE_PROVIDER_SITE_OTHER): Payer: BC Managed Care – PPO | Admitting: Family Medicine

## 2021-04-04 ENCOUNTER — Encounter: Payer: Self-pay | Admitting: Family Medicine

## 2021-04-04 VITALS — Temp 97.3°F | Wt 246.5 lb

## 2021-04-04 DIAGNOSIS — U071 COVID-19: Secondary | ICD-10-CM | POA: Diagnosis not present

## 2021-04-04 DIAGNOSIS — J45909 Unspecified asthma, uncomplicated: Secondary | ICD-10-CM

## 2021-04-04 DIAGNOSIS — R059 Cough, unspecified: Secondary | ICD-10-CM

## 2021-04-04 MED ORDER — PREDNISONE 20 MG PO TABS: 40.0000 mg | ORAL_TABLET | Freq: Every day | ORAL | 0 refills | Status: DC

## 2021-04-04 MED ORDER — BENZONATATE 200 MG PO CAPS
200.0000 mg | ORAL_CAPSULE | Freq: Two times a day (BID) | ORAL | 0 refills | Status: DC | PRN
Start: 2021-04-04 — End: 2021-08-16

## 2021-04-04 NOTE — Progress Notes (Signed)
Virtual Visit via Video Note  I connected with Jonathon Murphy  on 04/04/21 at  4:20 PM EDT by a video enabled telemedicine application and verified that I am speaking with the correct person using two identifiers.  Location patient: home, [place] Location provider:work or home office Persons participating in the virtual visit: patient, provider  I discussed the limitations of evaluation and management by telemedicine and the availability of in person appointments. The patient expressed understanding and agreed to proceed.   HPI:  Acute telemedicine visit for Covid19: -Onset: finished up Paxlovid 2-3 days ago -Symptoms include: was improving on the paxlovid, but the last 2 days the cough seems to be worsening -body aches and many of the other symptoms have resolved -Denies: CP, SOB, wheezing, hemoptysis, inability to eat/drink/get out of bed -Pertinent past medical history: asthma -Pertinent medication allergies: Allergies  Allergen Reactions   Bee Venom Anaphylaxis   Sulfa Antibiotics Shortness Of Breath   Benadryl [Diphenhydramine] Hives   Erythromycin Other (See Comments)    Does not know   Other Itching and Swelling    Anything in the "Nut family"   Suprax [Cefixime] Other (See Comments)    Does not know     Latex Rash   Tape Rash    Paper tape is ok  -COVID-19 vaccine status: 2 doses and booster  -his inhaler is helping some with the cough - but it returns quickly -has been on a steroid in the past which really help  ROS: See pertinent positives and negatives per HPI.  Past Medical History:  Diagnosis Date   Allergy    Anxiety    Asthma    Concussion    Depression    Migraines    Prediabetes    Scarring of lung     Past Surgical History:  Procedure Laterality Date   ADENOIDECTOMY     APPENDECTOMY     LAPAROSCOPIC APPENDECTOMY N/A 06/05/2017   Procedure: APPENDECTOMY LAPAROSCOPIC;  Surgeon: Gaynelle Adu, MD;  Location: [facility];  Service: General;  Laterality: N/A;    TONSILLECTOMY       Current Outpatient Medications:    acetaminophen (TYLENOL) 500 MG tablet, Take 500 mg by mouth every 6 (six) hours as needed., Disp: , Rfl:    albuterol (VENTOLIN HFA) 108 (90 Base) MCG/ACT inhaler, Inhale 2 puffs into the lungs every 6 (six) hours as needed for wheezing or shortness of breath (cough, shortness of breath or wheezing.)., Disp: 1 each, Rfl: 0   benzonatate (TESSALON) 200 MG capsule, Take 1 capsule (200 mg total) by mouth 2 (two) times daily as needed for cough., Disp: 20 capsule, Rfl: 0   EPINEPHrine 0.3 mg/0.3 mL IJ SOAJ injection, Inject 0.3 mLs (0.3 mg total) into the muscle as needed., Disp: 2 each, Rfl: 0   ibuprofen (ADVIL) 800 MG tablet, Take 1 tablet (800 mg total) by mouth every 8 (eight) hours as needed., Disp: 30 tablet, Rfl: 2   metFORMIN (GLUCOPHAGE-XR) 500 MG 24 hr tablet, TAKE 1 TABLET BY MOUTH AT BEDTIME., Disp: 30 tablet, Rfl: 1   predniSONE (DELTASONE) 20 MG tablet, Take 2 tablets (40 mg total) by mouth daily with breakfast., Disp: 8 tablet, Rfl: 0  EXAM:  VITALS per patient if applicable:  GENERAL: alert, oriented, appears well and in no acute distress  HEENT: atraumatic, conjunttiva clear, no obvious abnormalities on inspection of external nose and ears  NECK: normal movements of the head and neck  LUNGS: on inspection no signs of respiratory distress,  breathing rate appears normal, no obvious gross SOB, gasping or wheezing  CV: no obvious cyanosis  MS: moves all visible extremities without noticeable abnormality  PSYCH/NEURO: pleasant and cooperative, no obvious depression or anxiety, speech and thought processing grossly intact  ASSESSMENT AND PLAN:  Discussed the following assessment and plan:  COVID-19  Cough  Asthma, unspecified asthma severity, unspecified whether complicated, unspecified whether persistent  -we discussed possible serious and likely etiologies, options for evaluation and workup, limitations of  telemedicine visit vs in person visit, treatment, treatment risks and precautions. Pt prefers to treat via telemedicine empirically rather than in person at this moment.  Query COVID rebound symptoms, post viral cough, asthma versus other.  He denies chest pain, fevers or shortness of breath.  He opted to do another COVID test, try a higher dose of the Tessalon, use his albuterol as needed for excessive coughing every 4-6 hours and prednisone if feeling that he needs the albuterol more frequently or if any worsening.  Did advise in person evaluation if worsening, new symptoms arise or is not improving with these treatments.  Did advise reisolation for 5 days, +5 days of mask wearing. Work/School slipped offered: provided in patient instructions   Discussed options for inperson care if PCP office not available.   I discussed the assessment and treatment plan with the patient. The patient was provided an opportunity to ask questions and all were answered. The patient agreed with the plan and demonstrated an understanding of the instructions.     Terressa Koyanagi, DO

## 2021-04-04 NOTE — Patient Instructions (Addendum)
   ---------------------------------------------------------------------------------------------------------------------------      WORK SLIP:  Patient Jonathon Murphy,  December 12, 1985, was seen for a medical visit today, 04/04/21 . Please excuse from work for a COVID like illness. We suspect her may have rebound covid19. Advise staying home for 5 additional days and an improvement in symptoms, then 5 days of wearing a  high-quality, tight fitting mask such as N95 or KN95 at all times for an additional 5 days. Would also suggest COVID19 antigen testing is negative prior to return.  Sincerely: E-signature: Dr. Kriste Basque, DO [facility] Ph: [phone]   ------------------------------------------------------------------------------------------------------------------------------  -I sent the medication(s) we discussed to your pharmacy: Meds ordered this encounter  Medications   benzonatate (TESSALON) 200 MG capsule    Sig: Take 1 capsule (200 mg total) by mouth 2 (two) times daily as needed for cough.    Dispense:  20 capsule    Refill:  0   predniSONE (DELTASONE) 20 MG tablet    Sig: Take 2 tablets (40 mg total) by mouth daily with breakfast.    Dispense:  8 tablet    Refill:  0   Please use your albuterol every 4-6 hours as needed for excessive coughing.  Please isolate for an additional 5 days, +5 days of wearing a good mask after that if the repeat COVID test is positive.  I hope you are feeling better soon!  Seek in person care promptly if your symptoms worsen, new concerns arise or you are not improving with treatment.  It was nice to meet you today. I help [facility] out with telemedicine visits on Tuesdays and Thursdays and am available for visits on those days. If you have any concerns or questions following this visit please schedule a follow up visit with your Primary Care doctor or seek care at a local urgent care clinic to avoid delays in care.

## 2021-04-26 ENCOUNTER — Ambulatory Visit (INDEPENDENT_AMBULATORY_CARE_PROVIDER_SITE_OTHER): Payer: BC Managed Care – PPO | Admitting: Neurology

## 2021-04-26 DIAGNOSIS — E669 Obesity, unspecified: Secondary | ICD-10-CM

## 2021-04-26 DIAGNOSIS — G4733 Obstructive sleep apnea (adult) (pediatric): Secondary | ICD-10-CM | POA: Diagnosis not present

## 2021-04-26 DIAGNOSIS — G4719 Other hypersomnia: Secondary | ICD-10-CM

## 2021-04-26 DIAGNOSIS — R351 Nocturia: Secondary | ICD-10-CM

## 2021-04-26 DIAGNOSIS — G478 Other sleep disorders: Secondary | ICD-10-CM

## 2021-04-26 DIAGNOSIS — R0681 Apnea, not elsewhere classified: Secondary | ICD-10-CM

## 2021-04-26 DIAGNOSIS — R0683 Snoring: Secondary | ICD-10-CM

## 2021-04-26 DIAGNOSIS — G4720 Circadian rhythm sleep disorder, unspecified type: Secondary | ICD-10-CM

## 2021-04-26 DIAGNOSIS — Z820 Family history of epilepsy and other diseases of the nervous system: Secondary | ICD-10-CM

## 2021-05-03 NOTE — Procedures (Signed)
PATIENT'S NAME:  Jonathon, Murphy DOB:      07-01-1986      MR#:    [id number]     DATE OF RECORDING: 04/26/2021 REFERRING M.D.:  Jacquiline Doe, MD Study Performed:   Baseline Polysomnogram HISTORY: 35 year old man with a history of asthma, allergies, prediabetes, depression, anxiety, and obesity, who reports snoring and excessive daytime somnolence as well as witnessed apneas per wife's report. The patient endorsed the Epworth Sleepiness Scale at 10 points. The patient's weight 252 pounds with a height of 70 (inches), resulting in a BMI of 36. kg/m2. The patient's neck circumference measured 18 inches.  CURRENT MEDICATIONS: Tylenol, Proventil HFA, Voltaren, Epinephrine, Advil, Glucophage-XR,   PROCEDURE:  This is a multichannel digital polysomnogram utilizing the Somnostar 11.2 system.  Electrodes and sensors were applied and monitored per AASM Specifications.   EEG, EOG, Chin and Limb EMG, were sampled at 200 Hz.  ECG, Snore and Nasal Pressure, Thermal Airflow, Respiratory Effort, CPAP Flow and Pressure, Oximetry was sampled at 50 Hz. Digital video and audio were recorded.      BASELINE STUDY  Lights Out was at 20:48 and Lights On at 05:04.  Total recording time (TRT) was 496.5 minutes, with a total sleep time (TST) of 412.5 minutes.   The patient's sleep latency was 63 minutes, which is delayed. REM latency was 129.5 minutes, which is mildly delayed. The sleep efficiency was 83.1 %.     SLEEP ARCHITECTURE: WASO (Wake after sleep onset) was 45 minutes with mild to moderate sleep fragmentation noted. There were 62.5 minutes in Stage N1, 282 minutes Stage N2, 16.5 minutes Stage N3 and 51.5 minutes in Stage REM.  The percentage of Stage N1 was 15.2%, which is increased, Stage N2 was 68.4%, which is increased, Stage N3 was 4.% and Stage R (REM sleep) was 12.5%, which is reduced. The arousals were noted as: 91 were spontaneous, 0 were associated with PLMs, 9 were associated with respiratory  events.  RESPIRATORY ANALYSIS:  There were a total of 53 respiratory events:  0 obstructive apneas, 0 central apneas and 0 mixed apneas with a total of 0 apneas and an apnea index (AI) of 0 /hour. There were 53 hypopneas with a hypopnea index of 7.7 /hour. The patient also had 0 respiratory event related arousals (RERAs).      The total APNEA/HYPOPNEA INDEX (AHI) was 7.7/hour and the total RESPIRATORY DISTURBANCE INDEX was  7.7 /hour.  2 events occurred in REM sleep and 102 events in NREM. The REM AHI was  2.3 /hour, versus a non-REM AHI of 8.5. The patient spent 88 minutes of total sleep time in the supine position and 325 minutes in non-supine.. The supine AHI was 14.3 versus a non-supine AHI of 5.9.  OXYGEN SATURATION & C02:  The Wake baseline 02 saturation was 90%, with the lowest being 85%. Time spent below 89% saturation equaled 10 minutes.  PERIODIC LIMB MOVEMENTS: The patient had a total of 0 Periodic Limb Movements.  The Periodic Limb Movement (PLM) index was 0 and the PLM Arousal index was 0/hour.  Audio and video analysis did not show any abnormal or unusual movements, behaviors, phonations or vocalizations. The patient took 1 bathroom break. Snoring was noted, ranging from mild to loud. The EKG was in keeping with normal sinus rhythm (NSR).  Post-study, the patient indicated that sleep was the same as usual.   IMPRESSION:  Obstructive Sleep Apnea (OSA) Dysfunctions associated with sleep stages or arousal from sleep  RECOMMENDATIONS:  This study demonstrates overall mild obstructive sleep apnea, with a total AHI of 7.7/hour, REM AHI of 2.3/hour, supine AHI of 14.3/hour and O2 nadir of 85%. The absence of supine REM sleep may have led to some degree of underestimation of his sleep disordered breathing. Given the patient's medical history and sleep related complaints, treatment with positive airway pressure is a reasonable choice; this can be achieved in the form of autoPAP.  Alternatively, a full-night CPAP titration study would allow optimization of therapy if needed. Other treatment options may include avoidance of supine sleep position along with weight loss, upper airway or jaw surgery in selected patients or the use of an oral appliance in certain patients. ENT evaluation and/or consultation with a maxillofacial surgeon or dentist may be feasible in some instances.    Please note that untreated obstructive sleep apnea may carry additional perioperative morbidity. Patients with significant obstructive sleep apnea should receive perioperative PAP therapy and the surgeons and particularly the anesthesiologist should be informed of the diagnosis and the severity of the sleep disordered breathing. This study shows sleep fragmentation and abnormal sleep stage percentages; these are nonspecific findings and per se do not signify an intrinsic sleep disorder or a cause for the patient's sleep-related symptoms. Causes include (but are not limited to) the first night effect of the sleep study, circadian rhythm disturbances, medication effect or an underlying mood disorder or medical problem.  The patient should be cautioned not to drive, work at heights, or operate dangerous or heavy equipment when tired or sleepy. Review and reiteration of good sleep hygiene measures should be pursued with any patient. The patient will be seen in follow-up by Dr. Frances Furbish at [facility] for discussion of the test results and further management strategies. The referring provider will be notified of the test results.  I certify that I have reviewed the entire raw data recording prior to the issuance of this report in accordance with the Standards of Accreditation of the American Academy of Sleep Medicine (AASM)   Huston Foley, MD, PhD Diplomat, American Board of Neurology and Sleep Medicine (Neurology and Sleep Medicine)

## 2021-05-03 NOTE — Addendum Note (Signed)
Addended by: Huston Foley on: 05/03/2021 04:22 PM   Modules accepted: Orders

## 2021-05-09 ENCOUNTER — Encounter: Payer: Self-pay | Admitting: *Deleted

## 2021-05-09 ENCOUNTER — Telehealth: Payer: Self-pay | Admitting: *Deleted

## 2021-05-09 NOTE — Telephone Encounter (Signed)
LMVM for pt that have called again to give him sleep study results.  Will place results in mychart but also send a letter as well.   Huston Foley, MD  P Gna-Pod 4 Results Patient referred by Dr. Jimmey Ralph, seen by me on 03/22/21, diagnostic PSG on 04/26/21.     Please call and notify the patient that the recent sleep study did confirm the diagnosis of obstructive sleep apnea. OSA is overall mild, but worth treating to see if he feels better after treatment. To that end I recommend treatment for this in the form of autoPAP, which means, that we don't have to bring him back for a second sleep study with CPAP, but will let him try an autoPAP machine at home, through a DME company (of his choice, or as per insurance requirement). The DME representative will educate him on how to use the machine, how to put the mask on, etc. I have placed an order in the chart. Please send referral, talk to patient, send report to referring MD.  Alternative treatment options include weight loss and avoiding sleeping on the back and a dental device, but autoPAP would be considered the quickest and most effective option.  We will need a FU in sleep clinic for 10 weeks post-PAP set up, please arrange that with me or one of our NPs. Thanks,   Huston Foley, MD, PhD  [facility])

## 2021-08-09 ENCOUNTER — Ambulatory Visit: Payer: BC Managed Care – PPO | Admitting: Family

## 2021-08-09 ENCOUNTER — Encounter: Payer: Self-pay | Admitting: Family

## 2021-08-09 ENCOUNTER — Other Ambulatory Visit: Payer: Self-pay

## 2021-08-09 VITALS — BP 125/78 | HR 95 | Temp 98.0°F | Ht 70.0 in | Wt 245.2 lb

## 2021-08-09 DIAGNOSIS — J029 Acute pharyngitis, unspecified: Secondary | ICD-10-CM

## 2021-08-09 DIAGNOSIS — J0110 Acute frontal sinusitis, unspecified: Secondary | ICD-10-CM | POA: Diagnosis not present

## 2021-08-09 LAB — POCT RAPID STREP A (OFFICE): Rapid Strep A Screen: NEGATIVE

## 2021-08-09 MED ORDER — METHYLPREDNISOLONE ACETATE 80 MG/ML IJ SUSP
80.0000 mg | Freq: Once | INTRAMUSCULAR | Status: AC
  Administered 2021-08-09: 10:00:00 80 mg via INTRAMUSCULAR

## 2021-08-09 MED ORDER — AMOXICILLIN-POT CLAVULANATE 875-125 MG PO TABS: 1.0000 | ORAL_TABLET | Freq: Two times a day (BID) | ORAL | 0 refills | Status: DC

## 2021-08-09 NOTE — Assessment & Plan Note (Signed)
Sx for over a week - reports never completely getting over being sick since having covid. Sending augmentin and giving steroid injection, reviewed use and SE. Continue OTC sinus/cold medications including generic Sudafed, Claritin D, or Mucinex per box directions, Ibuprofen up to 600mg  or generic Tylenol 1,000mg  every 6 hours. Increase water intake to at least 2 liters daily.

## 2021-08-09 NOTE — Patient Instructions (Addendum)
It was very nice to see you today!  As discussed, I have sent an antibiotic to your pharmacy, start this today.  Continue OTC sinus/cold medications including generic Sudafed, Claritin D, or Mucinex per box directions, Ibuprofen up to 600mg  or generic Tylenol 1,000mg  every 6 hours. OK to take Zinc up to 25mg  & up to 2,000mg  Vitamin C daily while having symptoms, then reduce doses to 3-4 days per week, or 1/2 dose daily to boost immunity.  Increase water intake to at least 2 liters daily.     PLEASE NOTE:  If you had any lab tests please let know if you have not heard back within a few days. You may see your results on MyChart before we have a chance to review them but we will give you a call once they are reviewed by . If we ordered any referrals today, please let us know if you have not heard from their office within the next week.   Please try these tips to maintain a healthy lifestyle:  Eat most of your calories during the day when you are active. Eliminate processed foods including packaged sweets (pies, cakes, cookies), reduce intake of potatoes, white bread, white pasta, and white rice. Look for whole grain options, oat flour or almond flour.  Each meal should contain half fruits/vegetables, one quarter protein, and one quarter carbs (no bigger than a computer mouse).  Cut down on sweet beverages. This includes juice, soda, and sweet tea. Also watch fruit intake, though this is a healthier sweet option, it still contains natural sugar! Limit to 3 servings daily.  Drink at least 1 glass of water with each meal and aim for at least 8 glasses per day  Exercise at least 150 minutes every week.

## 2021-08-09 NOTE — Progress Notes (Signed)
Subjective:     Patient ID: Jonathon Murphy, male    DOB: 06/04/1986, 35 y.o.   MRN: [id number]  Chief Complaint  Patient presents with   Nasal Congestion    He has taken Tylenol and Mucinex.    Sore Throat    Wife positive for Strep   Sinusitis    Worsened 1 week ago. Complains of being sick since Covid in  July.   Ear Pain    Right    HPI: Sinusitis: Patient complains of right ear pressure/pain, congestion, non productive cough, post nasal drip, sinus pressure, and sore throat, with no fever, chills, night sweats or weight loss. Onset of symptoms was 8 days ago, unchanged since that time. He is drinking moderate amounts of fluids.  Past history is significant for no history of pneumonia or bronchitis. Patient is non-smoker.   Health Maintenance Due  Topic Date Due   Pneumococcal Vaccine 55-51 Years old (1 - PCV) Never done   Hepatitis C Screening  Never done   INFLUENZA VACCINE  03/27/2021    Past Medical History:  Diagnosis Date   Allergy    Anxiety    Asthma    Concussion    Depression    Migraines    Prediabetes    Scarring of lung     Past Surgical History:  Procedure Laterality Date   ADENOIDECTOMY     APPENDECTOMY     LAPAROSCOPIC APPENDECTOMY N/A 06/05/2017   Procedure: APPENDECTOMY LAPAROSCOPIC;  Surgeon: Gaynelle Adu, MD;  Location: [facility];  Service: General;  Laterality: N/A;   TONSILLECTOMY      Outpatient Medications Prior to Visit  Medication Sig Dispense Refill   acetaminophen (TYLENOL) 500 MG tablet Take 500 mg by mouth every 6 (six) hours as needed.     albuterol (VENTOLIN HFA) 108 (90 Base) MCG/ACT inhaler Inhale 2 puffs into the lungs every 6 (six) hours as needed for wheezing or shortness of breath (cough, shortness of breath or wheezing.). 1 each 0   benzonatate (TESSALON) 200 MG capsule Take 1 capsule (200 mg total) by mouth 2 (two) times daily as needed for cough. 20 capsule 0   EPINEPHrine 0.3 mg/0.3 mL IJ SOAJ injection Inject 0.3 mLs  (0.3 mg total) into the muscle as needed. 2 each 0   ibuprofen (ADVIL) 800 MG tablet Take 1 tablet (800 mg total) by mouth every 8 (eight) hours as needed. 30 tablet 2   predniSONE (DELTASONE) 20 MG tablet Take 2 tablets (40 mg total) by mouth daily with breakfast. 8 tablet 0   metFORMIN (GLUCOPHAGE-XR) 500 MG 24 hr tablet TAKE 1 TABLET BY MOUTH AT BEDTIME. (Patient not taking: Reported on 08/09/2021) 30 tablet 1   No facility-administered medications prior to visit.    Allergies  Allergen Reactions   Bee Venom Anaphylaxis   Sulfa Antibiotics Shortness Of Breath   Benadryl [Diphenhydramine] Hives   Erythromycin Other (See Comments)    Does not know   Other Itching and Swelling    Anything in the "Nut family"   Suprax [Cefixime] Other (See Comments)    Does not know     Latex Rash   Tape Rash    Paper tape is ok        Objective:    Physical Exam Vitals and nursing note reviewed.  Constitutional:      General: He is not in acute distress.    Appearance: Normal appearance. He is obese.  HENT:  Head: Normocephalic.     Right Ear: Ear canal normal. Tympanic membrane is scarred.     Left Ear: Tympanic membrane and ear canal normal.     Mouth/Throat:     Mouth: Mucous membranes are moist.     Pharynx: Posterior oropharyngeal erythema present. No pharyngeal swelling or oropharyngeal exudate.     Tonsils: No tonsillar exudate or tonsillar abscesses.  Cardiovascular:     Rate and Rhythm: Normal rate and regular rhythm.  Pulmonary:     Effort: Pulmonary effort is normal.     Breath sounds: Normal breath sounds.  Musculoskeletal:        General: Normal range of motion.     Cervical back: Normal range of motion.  Skin:    General: Skin is warm and dry.  Neurological:     Mental Status: He is alert and oriented to person, place, and time.  Psychiatric:        Mood and Affect: Mood normal.    BP 125/78    Pulse 95    Temp 98 F (36.7 C) (Temporal)    Ht 5\' 10"  (1.778  m)    Wt 245 lb 3.2 oz (111.2 kg)    SpO2 98%    BMI 35.18 kg/m  Wt Readings from Last 3 Encounters:  08/09/21 245 lb 3.2 oz (111.2 kg)  04/04/21 246 lb 8 oz (111.8 kg)  03/22/21 252 lb (114.3 kg)       Assessment & Plan:   Problem List Items Addressed This Visit       Respiratory   Acute non-recurrent frontal sinusitis    Sx for over a week - reports never completely getting over being sick since having covid. Sending augmentin and giving steroid injection, reviewed use and SE. Continue OTC sinus/cold medications including generic Sudafed, Claritin D, or Mucinex per box directions, Ibuprofen up to 600mg  or generic Tylenol 1,000mg  every 6 hours. Increase water intake to at least 2 liters daily.        Relevant Medications   amoxicillin-clavulanate (AUGMENTIN) 875-125 MG tablet     Other   Sore throat - Primary    Strep negative. Advised on warm salt water gargles, Ibuprofen 600mg  tid after eating.      Relevant Orders   POCT rapid strep A (Completed)    Meds ordered this encounter  Medications   amoxicillin-clavulanate (AUGMENTIN) 875-125 MG tablet    Sig: Take 1 tablet by mouth 2 (two) times daily after a meal.    Dispense:  14 tablet    Refill:  0    Order Specific Question:   Supervising Provider    Answer:   ANDY, CAMILLE L [[id number]]   methylPREDNISolone acetate (DEPO-MEDROL) injection 80 mg

## 2021-08-09 NOTE — Assessment & Plan Note (Signed)
Strep negative. Advised on warm salt water gargles, Ibuprofen 600mg  tid after eating.

## 2021-08-16 ENCOUNTER — Encounter: Payer: Self-pay | Admitting: Family Medicine

## 2021-08-16 ENCOUNTER — Ambulatory Visit (INDEPENDENT_AMBULATORY_CARE_PROVIDER_SITE_OTHER): Payer: BC Managed Care – PPO | Admitting: Family Medicine

## 2021-08-16 ENCOUNTER — Other Ambulatory Visit: Payer: Self-pay

## 2021-08-16 VITALS — BP 130/82 | HR 78 | Temp 97.6°F | Ht 70.0 in | Wt 244.8 lb

## 2021-08-16 DIAGNOSIS — F338 Other recurrent depressive disorders: Secondary | ICD-10-CM | POA: Diagnosis not present

## 2021-08-16 DIAGNOSIS — E785 Hyperlipidemia, unspecified: Secondary | ICD-10-CM | POA: Diagnosis not present

## 2021-08-16 DIAGNOSIS — F909 Attention-deficit hyperactivity disorder, unspecified type: Secondary | ICD-10-CM | POA: Diagnosis not present

## 2021-08-16 DIAGNOSIS — J4520 Mild intermittent asthma, uncomplicated: Secondary | ICD-10-CM | POA: Diagnosis not present

## 2021-08-16 DIAGNOSIS — Z6835 Body mass index (BMI) 35.0-35.9, adult: Secondary | ICD-10-CM

## 2021-08-16 DIAGNOSIS — E669 Obesity, unspecified: Secondary | ICD-10-CM

## 2021-08-16 DIAGNOSIS — Z0001 Encounter for general adult medical examination with abnormal findings: Secondary | ICD-10-CM

## 2021-08-16 DIAGNOSIS — G4733 Obstructive sleep apnea (adult) (pediatric): Secondary | ICD-10-CM

## 2021-08-16 DIAGNOSIS — Z87892 Personal history of anaphylaxis: Secondary | ICD-10-CM

## 2021-08-16 DIAGNOSIS — R739 Hyperglycemia, unspecified: Secondary | ICD-10-CM

## 2021-08-16 MED ORDER — EPINEPHRINE 0.3 MG/0.3ML IJ SOAJ
0.3000 mg | INTRAMUSCULAR | 0 refills | Status: AC | PRN
Start: 2021-08-16 — End: ?

## 2021-08-16 MED ORDER — EPINEPHRINE 0.3 MG/0.3ML IJ SOAJ: 0.3000 mg | INTRAMUSCULAR | 0 refills | Status: DC | PRN

## 2021-08-16 MED ORDER — BUPROPION HCL ER (XL) 150 MG PO TB24: 150.0000 mg | ORAL_TABLET | Freq: Every day | ORAL | 5 refills | Status: DC

## 2021-08-16 MED ORDER — AZITHROMYCIN 250 MG PO TABS: ORAL_TABLET | ORAL | 0 refills | Status: DC

## 2021-08-16 MED ORDER — EPINEPHRINE 0.3 MG/0.3ML IJ SOAJ
0.3000 mg | INTRAMUSCULAR | 0 refills | Status: DC | PRN
Start: 2021-08-16 — End: 2021-08-16

## 2021-08-16 MED ORDER — METFORMIN HCL ER 500 MG PO TB24: 500.0000 mg | ORAL_TABLET | Freq: Every day | ORAL | 1 refills | Status: DC

## 2021-08-16 NOTE — Progress Notes (Signed)
Chief Complaint:  Jonathon Murphy is a 35 y.o. male who presents today for his annual comprehensive physical exam.    Assessment/Plan:  New/Acute Problems: Sinusitis Instructed patient to finish his course of Augmentin.  We will send in pocket prescription for azithromycin if symptoms do not improve over the next few days.  Chronic Problems Addressed Today: Seasonal affective disorder (HCC) PHQ score elevated.  Had lengthy discussion with patient today regarding his symptoms.  No active SI or HI.  Given his concurrent ADHD will place referral to psychiatry for further evaluation and management.  We discussed treatment options and short-term.  Would be reasonable to start Wellbutrin given his concurrent ADHD.  We will start with 150 mg daily.  We discussed potential side effects.  He has no history of bipolar or manic episodes.  He will check with me in a couple weeks via MyChart.  ADHD Will place referral to psychiatry.  OSA (obstructive sleep apnea) Diagnosed with OSA.  Has not yet started CPAP.  Dyslipidemia Check lipids.  Hyperglycemia Check A1c.  Metformin refilled.  History of anaphylaxis EpiPen refilled.  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  Usually worse during winter months.  He has not had PFTs done for several years.  Will place referral to pulmonology for further evaluation management.  He uses albuterol as needed.   Body mass index is 35.13 kg/m. / Obese  BMI Metric Follow Up - 08/09/21 0939       BMI Metric Follow Up-Please document annually   BMI Metric Follow Up Education provided              Preventative Healthcare: UTD on vaccines. Check labs.   Patient Counseling(The following topics were reviewed and/or handout was given):  -Nutrition: Stressed importance of moderation in sodium/caffeine intake, saturated fat and cholesterol, caloric balance, sufficient intake of fresh fruits, vegetables, and  fiber.  -Stressed the importance of regular exercise.   -Substance Abuse: Discussed cessation/primary prevention of tobacco, alcohol, or other drug use; driving or other dangerous activities under the influence; availability of treatment for abuse.   -Injury prevention: Discussed safety belts, safety helmets, smoke detector, smoking near bedding or upholstery.   -Sexuality: Discussed sexually transmitted diseases, partner selection, use of condoms, avoidance of unintended pregnancy and contraceptive alternatives.   -Dental health: Discussed importance of regular tooth brushing, flossing, and dental visits.  -Health maintenance and immunizations reviewed. Please refer to Health maintenance section.  Return to care in 1 year for next preventative visit.     Subjective:  HPI:  See A/P for status of chronic conditions.  He has had worsening mood for the last several months.  He has been diagnosed with depression and seasonal affective disorder in the past.  This could be contributing.  He has also had quite a bit of stress due to his work recently.  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  Unfortunately he has had a few of the children he works with pass away recently.  This has begun to impact his job.  He has been easily distracted.  He sometimes feels like he would be better off not being here but has no specific plans.  He has seen therapist in the past.  He has also been diagnosed with ADHD in the past and was treated with stimulants.  No history of manic episodes.  He did have a gambling issue at one point in the past  though he attributes this more to a need to find money quickly.  No episodes of feeling "on top of the world" or not needing sleep.  He has never been diagnosed with bipolar.  Still has ongoing issues with scarring in his left lung.  This has been going on for several years since being diagnosed with a flu pneumonia.  He recently had a URI.  Concern  for sinusitis.  Started on Augmentin.  Still has some sinus congestion.  Still has some difficulty with taking a deep breath.  Has frequent cough.  He has been having thick mucus production for several weeks.   Depression screen PHQ 2/9 08/16/2021  Decreased Interest 2  Down, Depressed, Hopeless 3  PHQ - 2 Score 5  Altered sleeping 0  Tired, decreased energy 3  Change in appetite 1  Feeling bad or failure about yourself  3  Trouble concentrating 2  Moving slowly or fidgety/restless 2  Suicidal thoughts 1  PHQ-9 Score 17  Difficult doing work/chores Very difficult    Health Maintenance Due  Topic Date Due   Pneumococcal Vaccine 21-36 Years old (1 - PCV) Never done   Hepatitis C Screening  Never done     ROS: Per HPI, otherwise a complete review of systems was negative.   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 05/19/2019   Migraine 05/19/2019   History of anaphylaxis 05/19/2019   Past Surgical History:  Procedure Laterality Date   ADENOIDECTOMY     APPENDECTOMY     LAPAROSCOPIC APPENDECTOMY N/A 06/05/2017   Procedure: APPENDECTOMY LAPAROSCOPIC;  Surgeon: Gaynelle Adu, MD;  Location: [facility];  Service: General;  Laterality: N/A;   TONSILLECTOMY      Family History  Problem Relation Age of Onset   Arthritis Mother    Miscarriages / [place] Mother    Sleep apnea Mother    Arthritis Father    Diabetes Father    Heart disease Father    Sleep apnea  Father    Arthritis Sister    Depression Sister    Arthritis Maternal Grandmother    Diabetes Maternal Grandmother    Cancer Maternal Grandfather    Skin cancer Maternal Grandfather    Heart disease Paternal Grandmother    Cancer Paternal Grandfather    Drug abuse Paternal Grandfather    Diabetes Paternal Grandfather     Medications- reviewed and updated Current Outpatient Medications  Medication Sig Dispense Refill   acetaminophen (TYLENOL) 500 MG tablet Take 500 mg by mouth every 6 (six) hours as needed.     albuterol (VENTOLIN HFA) 108 (90 Base) MCG/ACT inhaler Inhale 2 puffs into the lungs every 6 (six) hours as needed for wheezing or shortness of breath (cough, shortness of breath or wheezing.). 1 each 0   azithromycin (ZITHROMAX) 250 MG tablet Take 2 tabs day 1, then 1 tab daily 6 each 0   buPROPion [facility]  XL) 150 MG 24 hr tablet Take 1 tablet (150 mg total) by mouth daily. 30 tablet 5   ibuprofen (ADVIL) 800 MG tablet Take 1 tablet (800 mg total) by mouth every 8 (eight) hours as needed. 30 tablet 2   EPINEPHrine 0.3 mg/0.3 mL IJ SOAJ injection Inject 0.3 mg into the muscle as needed. 2 each 0   metFORMIN (GLUCOPHAGE-XR) 500 MG 24 hr tablet Take 1 tablet (500 mg total) by mouth at bedtime. 30 tablet 1   No current facility-administered medications for this visit.    Allergies-reviewed and updated Allergies  Allergen Reactions   Bee Venom Anaphylaxis   Sulfa Antibiotics Shortness Of Breath   Benadryl [Diphenhydramine] Hives   Erythromycin Other (See Comments)    Does not know   Other Itching and Swelling    Anything in the "Nut family"   Suprax [Cefixime] Other (See Comments)    Does not know     Latex Rash   Tape Rash    Paper tape is ok    Social History   Socioeconomic History   Marital status: Married    Spouse name: Not on file   Number of children: 1   Years of education: Not on file   Highest education level: Bachelor's degree (e.g., BA, AB, BS)   Occupational History   Not on file  Tobacco Use   Smoking status: Never   Smokeless tobacco: Never  Vaping Use   Vaping Use: Never used  Substance and Sexual Activity   Alcohol use: Yes    Comment: 1 drink every 2 weeks if on the golf course   Drug use: Never   Sexual activity: Not on file  Other Topics Concern   Not on file  Social History Narrative   Lives at home with wife and daughter   Right handed   Caffeine: 4 sodas per day, each 16 oz    Social Determinants of Health   Financial Resource Strain: Not on file  Food Insecurity: Not on file  Transportation Needs: Not on file  Physical Activity: Not on file  Stress: Not on file  Social Connections: Not on file        Objective:  Physical Exam: BP 130/82 (BP Location: Right Arm)    Pulse 78    Temp 97.6 F (36.4 C) (Temporal)    Ht 5\' 10"  (1.778 m)    Wt 244 lb 12.8 oz (111 kg)    SpO2 98%    BMI 35.13 kg/m   Body mass index is 35.13 kg/m. Wt Readings from Last 3 Encounters:  08/16/21 244 lb 12.8 oz (111 kg)  08/09/21 245 lb 3.2 oz (111.2 kg)  04/04/21 246 lb 8 oz (111.8 kg)   Gen: NAD, resting comfortably HEENT: TMs normal bilaterally. OP clear. No thyromegaly noted.  CV: RRR with no murmurs appreciated Pulm: NWOB, CTAB with no crackles, wheezes, or rhonchi GI: Normal bowel sounds present. Soft, Nontender, Nondistended. MSK: no edema, cyanosis, or clubbing noted Skin: warm, dry Neuro: CN2-12 grossly intact. Strength 5/5 in upper and lower extremities. Reflexes symmetric and intact bilaterally.  Psych: Normal affect and thought content     Jylian Pappalardo M. 06/04/21, MD 08/16/2021 9:24 AM

## 2021-08-16 NOTE — Patient Instructions (Signed)
It was very nice to see you today!  Please start Wellbutrin.  Send me a message in a few weeks to let me know how this is working for you.  I will refer you to see a psychiatrist.  I will send in a pocket prescription for azithromycin.  Please do not start this unless her symptoms are not improving over the next few days.  I will refer you to see a pulmonologist for lung function testing.  I will refill your other medications today.  We will check blood work later this week.  I will see back in year for your next physical.  Take care, Dr Jimmey Ralph  PLEASE NOTE:  If you had any lab tests please let us know if you have not heard back within a few days. You may see your results on mychart before we have a chance to review them but we will give you a call once they are reviewed by [place]. If we ordered any referrals today, please let us know if you have not heard from their office within the next week.   Please try these tips to maintain a healthy lifestyle:  Eat at least 3 REAL meals and 1-2 snacks per day.  Aim for no more than 5 hours between eating.  If you eat breakfast, please do so within one hour of getting up.   Each meal should contain half fruits/vegetables, one quarter protein, and one quarter carbs (no bigger than a computer mouse)  Cut down on sweet beverages. This includes juice, soda, and sweet tea.   Drink at least 1 glass of water with each meal and aim for at least 8 glasses per day  Exercise at least 150 minutes every week.    Preventive Care 27-89 Years Old, Male Preventive care refers to lifestyle choices and visits with your health care provider that can promote health and wellness. Preventive care visits are also called wellness exams. What can I expect for my preventive care visit? Counseling During your preventive care visit, your health care provider may ask about your: Medical history, including: Past medical problems. Family medical history. Current health,  including: Emotional well-being. Home life and relationship well-being. Sexual activity. Lifestyle, including: Alcohol, nicotine or tobacco, and drug use. Access to firearms. Diet, exercise, and sleep habits. Safety issues such as seatbelt and bike helmet use. Sunscreen use. Work and work Astronomer. Physical exam Your health care provider may check your: Height and weight. These may be used to calculate your BMI (body mass index). BMI is a measurement that tells if you are at a healthy weight. Waist circumference. This measures the distance around your waistline. This measurement also tells if you are at a healthy weight and may help predict your risk of certain diseases, such as type 2 diabetes and high blood pressure. Heart rate and blood pressure. Body temperature. Skin for abnormal spots. What immunizations do I need? Vaccines are usually given at various ages, according to a schedule. Your health care provider will recommend vaccines for you based on your age, medical history, and lifestyle or other factors, such as travel or where you work. What tests do I need? Screening Your health care provider may recommend screening tests for certain conditions. This may include: Lipid and cholesterol levels. Diabetes screening. This is done by checking your blood sugar (glucose) after you have not eaten for a while (fasting). Hepatitis B test. Hepatitis C test. HIV (human immunodeficiency virus) test. STI (sexually transmitted infection) testing, if you  are at risk. Talk with your health care provider about your test results, treatment options, and if necessary, the need for more tests. Follow these instructions at home: Eating and drinking  Eat a healthy diet that includes fresh fruits and vegetables, whole grains, lean protein, and low-fat dairy products. Drink enough fluid to keep your urine pale yellow. Take vitamin and mineral supplements as recommended by your health care  provider. Do not drink alcohol if your health care provider tells you not to drink. If you drink alcohol: Limit how much you have to 0-2 drinks a day. Know how much alcohol is in your drink. In the [place] one drink equals one 12 oz bottle of beer (355 mL), one 5 oz glass of wine (148 mL), or one 1 oz glass of hard liquor (44 mL). Lifestyle Brush your teeth every morning and night with fluoride toothpaste. Floss one time each day. Exercise for at least 30 minutes 5 or more days each week. Do not use any products that contain nicotine or tobacco. These products include cigarettes, chewing tobacco, and vaping devices, such as e-cigarettes. If you need help quitting, ask your health care provider. Do not use drugs. If you are sexually active, practice safe sex. Use a condom or other form of protection to prevent STIs. Find healthy ways to manage stress, such as: Meditation, yoga, or listening to music. Journaling. Talking to a trusted person. Spending time with friends and family. Minimize exposure to UV radiation to reduce your risk of skin cancer. Safety Always wear your seat belt while driving or riding in a vehicle. Do not drive: If you have been drinking alcohol. Do not ride with someone who has been drinking. If you have been using any mind-altering substances or drugs. While texting. When you are tired or distracted. Wear a helmet and other protective equipment during sports activities. If you have firearms in your house, make sure you follow all gun safety procedures. Seek help if you have been physically or sexually abused. What's next? Go to your health care provider once a year for an annual wellness visit. Ask your health care provider how often you should have your eyes and teeth checked. Stay up to date on all vaccines. This information is not intended to replace advice given to you by your health care provider. Make sure you discuss any questions you have with your health  care provider. Document Revised: 02/08/2021 Document Reviewed: 02/08/2021 Elsevier Patient Education  2022 [employer].

## 2021-08-16 NOTE — Assessment & Plan Note (Signed)
Check A1c.  Metformin refilled.

## 2021-08-16 NOTE — Assessment & Plan Note (Signed)
PHQ score elevated.  Had lengthy discussion with patient today regarding his symptoms.  No active SI or HI.  Given his concurrent ADHD will place referral to psychiatry for further evaluation and management.  We discussed treatment options and short-term.  Would be reasonable to start Wellbutrin given his concurrent ADHD.  We will start with 150 mg daily.  We discussed potential side effects.  He has no history of bipolar or manic episodes.  He will check with me in a couple weeks via MyChart.

## 2021-08-16 NOTE — Assessment & Plan Note (Signed)
Diagnosed with OSA.  Has not yet started CPAP.

## 2021-08-16 NOTE — Assessment & Plan Note (Signed)
EpiPen refilled 

## 2021-08-16 NOTE — Assessment & Plan Note (Signed)
Will place referral to psychiatry.

## 2021-08-16 NOTE — Assessment & Plan Note (Signed)
Check lipids 

## 2021-08-16 NOTE — Assessment & Plan Note (Signed)
Still ongoing issue.  Usually worse during winter months.  He has not had PFTs done for several years.  Will place referral to pulmonology for further evaluation management.  He uses albuterol as needed.

## 2021-08-22 ENCOUNTER — Other Ambulatory Visit: Payer: BC Managed Care – PPO

## 2021-08-29 ENCOUNTER — Other Ambulatory Visit (INDEPENDENT_AMBULATORY_CARE_PROVIDER_SITE_OTHER): Payer: BC Managed Care – PPO

## 2021-08-29 ENCOUNTER — Other Ambulatory Visit: Payer: Self-pay

## 2021-08-29 DIAGNOSIS — E785 Hyperlipidemia, unspecified: Secondary | ICD-10-CM | POA: Diagnosis not present

## 2021-08-29 DIAGNOSIS — Z0001 Encounter for general adult medical examination with abnormal findings: Secondary | ICD-10-CM | POA: Diagnosis not present

## 2021-08-29 DIAGNOSIS — R739 Hyperglycemia, unspecified: Secondary | ICD-10-CM | POA: Diagnosis not present

## 2021-08-29 LAB — COMPREHENSIVE METABOLIC PANEL
ALT: 62 U/L — ABNORMAL HIGH (ref 0–53)
AST: 26 U/L (ref 0–37)
Albumin: 4.4 g/dL (ref 3.5–5.2)
Alkaline Phosphatase: 66 U/L (ref 39–117)
BUN: 12 mg/dL (ref 6–23)
CO2: 27 mEq/L (ref 19–32)
Calcium: 9.2 mg/dL (ref 8.4–10.5)
Chloride: 103 mEq/L (ref 96–112)
Creatinine, Ser: 0.94 mg/dL (ref 0.40–1.50)
GFR: 105.08 mL/min (ref >60.00)
Glucose, Bld: 132 mg/dL — ABNORMAL HIGH (ref 70–99)
Potassium: 4.7 mEq/L (ref 3.5–5.1)
Sodium: 139 mEq/L (ref 135–145)
Total Bilirubin: 0.7 mg/dL (ref 0.2–1.2)
Total Protein: 7 g/dL (ref 6.0–8.3)

## 2021-08-29 LAB — LIPID PANEL
Cholesterol: 186 mg/dL (ref 0–200)
HDL: 42.9 mg/dL (ref >39.00)
LDL Cholesterol: 121 mg/dL — ABNORMAL HIGH (ref 0–99)
NonHDL: 143.44
Total CHOL/HDL Ratio: 4
Triglycerides: 112 mg/dL (ref 0.0–149.0)
VLDL: 22.4 mg/dL (ref 0.0–40.0)

## 2021-08-29 LAB — CBC
HCT: 44.2 % (ref 39.0–52.0)
Hemoglobin: 15 g/dL (ref 13.0–17.0)
MCHC: 34 g/dL (ref 30.0–36.0)
MCV: 82.7 fl (ref 78.0–100.0)
Platelets: 196 10*3/uL (ref 150.0–400.0)
RBC: 5.34 Mil/uL (ref 4.22–5.81)
RDW: 13.5 % (ref 11.5–15.5)
WBC: 6.9 10*3/uL (ref 4.0–10.5)

## 2021-08-29 LAB — TSH: TSH: 3.19 u[IU]/mL (ref 0.35–5.50)

## 2021-08-29 LAB — HEMOGLOBIN A1C: Hgb A1c MFr Bld: 6.6 % — ABNORMAL HIGH (ref 4.6–6.5)

## 2021-08-30 NOTE — Progress Notes (Signed)
Please inform patient of the following:  Blood sugar elevated into diabetic range.  Recommend he increase metformin to 1000mg  daily.   Cholesterol is borderline but stable.   Liver numbers are still slightly elevated but better than last year.  Everything else is NORMAL. He should continue to work on diet and exercise.  Would like for him to come back in 3 months to recheck A1c.  Jonathon Murphy. Jerline Pain, MD 08/30/2021 10:40 AM

## 2021-08-31 ENCOUNTER — Other Ambulatory Visit: Payer: Self-pay | Admitting: *Deleted

## 2021-08-31 MED ORDER — METFORMIN HCL 1000 MG PO TABS: 1000.0000 mg | ORAL_TABLET | Freq: Every day | ORAL | 0 refills | Status: DC

## 2021-09-26 ENCOUNTER — Encounter: Payer: Self-pay | Admitting: Internal Medicine

## 2021-09-26 ENCOUNTER — Ambulatory Visit: Payer: BC Managed Care – PPO | Admitting: Internal Medicine

## 2021-09-26 ENCOUNTER — Other Ambulatory Visit: Payer: Self-pay

## 2021-09-26 VITALS — BP 126/74 | HR 101 | Temp 98.5°F | Ht 70.0 in | Wt 238.6 lb

## 2021-09-26 DIAGNOSIS — J4540 Moderate persistent asthma, uncomplicated: Secondary | ICD-10-CM

## 2021-09-26 LAB — POCT EXHALED NITRIC OXIDE: FeNO level (ppb): 16

## 2021-09-26 MED ORDER — FLUTICASONE-SALMETEROL 100-50 MCG/ACT IN AEPB: 1.0000 | INHALATION_SPRAY | Freq: Two times a day (BID) | RESPIRATORY_TRACT | 5 refills | Status: DC

## 2021-09-26 NOTE — Progress Notes (Signed)
The patient has been prescribed the inhaler advair. Inhaler technique was demonstrated to patient. The patient subsequently demonstrated correct technique.  

## 2021-09-26 NOTE — Patient Instructions (Signed)
Please schedule follow up scheduled with myself in 2 months.  If my schedule is not open yet, we will contact you with a reminder closer to that time. Please call [phone] if you haven't heard from [place] a month before.   Start advair 1 puff twice a day. Gargle after use.   Continue albuterol inhaler as needed

## 2021-09-26 NOTE — Progress Notes (Signed)
Jonathon Murphy    [id number]    03/27/1986  Primary Care Physician:Parker, Algis Greenhouse, MD  Referring Physician: Vivi Barrack, [address] [place],  [place] [address] Reason for Consultation: left lung pain Date of Consultation: 09/26/2021  Chief complaint:   Chief Complaint  Patient presents with   Consult    Left lung pain, productive cough      HPI: Jonathon Murphy is a 36 y.o. man who presents for new patient evaluation for lung pain and cough.   He has a history of influenza in 6 years ago. Had some scarring then and has trouble feeling like his left lower lung can fully inflate. He had covid infection in Jul 2022. Since then has been "sick" every other week with chronic cough, shortness of breath. Both at rest and exertion. More recently he's coughed up some specks blood. Notes sinus congestion and drainage.   Takes over the counter decongestants and cold remedies. He has been prescribed antibiotics and steroids which help for a short while. Last time he had a steroid shot and followed by prednisone.   He has a history of allergies as a child and took SCIT for 8 years Had recurrent tonsillitis and had his adenoids were removed.   He played trombone in marching band and notes always having normal spirometry in the past, nothing done since he was a teenager.   He lives in [place] and his grand father had valley fever.   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 Exposures: lives at home with wife and 85 month daughter Smoking history: never smoker, no passive smoke exposure  Social History   Occupational History   Not on file  Tobacco Use   Smoking status: Never   Smokeless tobacco: Never  Vaping Use   Vaping Use: Never used  Substance and Sexual Activity   Alcohol use: Yes    Comment: 1 drink every 2 weeks if [place]  the [place]   Drug use: Never   Sexual activity: Not on file    Relevant family history:  Family History  Problem Relation Age of Onset   Arthritis Mother    Miscarriages / [place] Mother    Sleep apnea Mother    Arthritis Father    Diabetes Father    Heart disease Father    Sleep apnea Father    Arthritis Sister    Depression Sister    Arthritis Maternal Grandmother    Diabetes Maternal Grandmother    Cancer Maternal Grandfather    Skin cancer Maternal Grandfather    Heart disease Paternal Grandmother    Cancer Paternal Grandfather    Drug abuse Paternal Grandfather    Diabetes Paternal Grandfather     Past Medical History:  Diagnosis Date   Allergy    Anxiety    Asthma    Concussion    Depression    Migraines    Prediabetes    Scarring of lung     Past Surgical History:  Procedure Laterality Date   ADENOIDECTOMY     APPENDECTOMY     LAPAROSCOPIC APPENDECTOMY N/A 06/05/2017   Procedure: APPENDECTOMY LAPAROSCOPIC;  Surgeon: Greer Pickerel, MD;  Location: [facility];  Service: General;  Laterality: N/A;   TONSILLECTOMY       Physical Exam: Blood pressure 126/74, pulse Marland Kitchen)  101, temperature 98.5 F (36.9 C), temperature source Oral, height '5\' 10"'  (1.778 m), weight 238 lb 9.6 oz (108.2 kg), SpO2 99 %. Gen:      No acute distress ENT:  Mallampati IV, no nasal polyps Lungs:    No increased respiratory effort, symmetric chest wall excursion, clear to auscultation bilaterally, no wheezes or crackles CV:         Regular rate and rhythm; no murmurs, rubs, or gallops.  No pedal edema Abd:      + bowel sounds; soft, non-tender; no distension MSK: no acute synovitis of DIP or PIP joints, no mechanics hands.  Skin:      Warm and dry; no rashes Neuro: normal speech, no focal facial asymmetry Psych: alert and oriented x3, normal mood and affect   Data Reviewed/Medical Decision Making:  Independent interpretation of tests: Imaging:   PFTs: I have personally  reviewed the patient's PFTs and spirometry obtained is normal.  No flowsheet data found.  Labs:   Immunization status:  Immunization History  Administered Date(s) Administered   DT (Pediatric) 05/06/2000   DTaP 06/05/1999, 07/29/2002, 07/18/2005, 06/07/2006, 06/16/2007   Influenza,inj,Quad PF,6+ Mos 05/19/2019   Influenza-Unspecified 07/17/2021   MMR 06/05/1999   Meningococcal Polysaccharide 02/05/2005   OPV 06/05/1999   PFIZER(Purple Top)SARS-COV-2 Vaccination 11/04/2019, 11/25/2019, 10/25/2020   PPD Test 07/16/2011, 09/10/2012, 08/05/2013     I reviewed prior external note(s) from PCP, urgent care, ED  I reviewed the result(s) of the labs and imaging as noted above.   I have ordered spirometry and feno  Assessment:  Moderate persistent asthma with worsening symptoms OSA not yet on CPAP  Plan/Recommendations:  Spirometry and Feno obtained. FENo 16 ppb today.  Spirometry was normal and suggested restriction.  Continue albuterol as needed.  Will initiate ICS LABA with advair HFA today. Concern that this is worsening of his asthma following viral infection like covid.  Encouraged him to follow up for treatment of his OSA.   We discussed disease management and progression at length today regarding asthma.    Return to Care: Return in about 2 months (around 11/24/2021).  [name], MD Pulmonary and [place]  CC: Vivi Barrack, MD

## 2021-10-31 ENCOUNTER — Ambulatory Visit: Payer: BC Managed Care – PPO | Admitting: Internal Medicine

## 2021-10-31 ENCOUNTER — Encounter: Payer: Self-pay | Admitting: Internal Medicine

## 2021-10-31 ENCOUNTER — Other Ambulatory Visit: Payer: Self-pay

## 2021-10-31 VITALS — BP 124/82 | HR 89 | Ht 70.0 in | Wt 248.0 lb

## 2021-10-31 DIAGNOSIS — J4540 Moderate persistent asthma, uncomplicated: Secondary | ICD-10-CM | POA: Diagnosis not present

## 2021-10-31 DIAGNOSIS — G4733 Obstructive sleep apnea (adult) (pediatric): Secondary | ICD-10-CM | POA: Diagnosis not present

## 2021-10-31 NOTE — Progress Notes (Signed)
? ?      Jonathon Murphy    [id number]    1985/12/21 ? ?Primary Care Physician:Parker, Algis Greenhouse, MD ?Date of Appointment: 10/31/2021 ?Established Patient Visit ? ?Chief complaint:   ?Chief Complaint  ?Patient presents with  ? Follow-up  ?  2 mo f/u after starting Advair. Has not noticed a difference in his SOB but the cough has improved.   ? ? ? ?HPI: ?Jonathon Murphy is a 36 y.o. man with severe persistent asthma ? ?Interval Updates: ?Here for follow up after starting advair.  ? ?Cough is improving. Still short of breath but overall things are better.  ? ?He hasn't needed to use his rescue inhaler since starting advair.  ? ?He has folliculitis which is flaring since he started the advair. Wonders If this is related. Normally gets flares on his arms and sometimes they require antibiotics.  ? ?He also has pain when he takes a deep breath in which has been there for the last 5 years. Has tried stretching and this doesn't help. He does exercise and it doesn't seem to get worse. The pain is constant and always on his left side. It is relieved by ibuprofen and acetaminophen.  ? ? 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 ?FeNO: 16 ppb ? ? ?I have reviewed the patient's family social and past medical history and updated as appropriate.  ? ?Past Medical History:  ?Diagnosis Date  ? Allergy   ? Anxiety   ? Asthma   ? Concussion   ? Depression   ? Migraines   ? Prediabetes   ? Scarring of lung   ? ? ?Past Surgical History:  ?Procedure Laterality Date  ? ADENOIDECTOMY    ? APPENDECTOMY    ? LAPAROSCOPIC APPENDECTOMY N/A 06/05/2017  ? Procedure: APPENDECTOMY LAPAROSCOPIC;  Surgeon: Greer Pickerel, MD;  Location: [facility];  Service:  General;  Laterality: N/A;  ? TONSILLECTOMY    ? ? ?Family History  ?Problem Relation Age of Onset  ? Arthritis Mother   ? Miscarriages / [place] Mother   ? Sleep apnea Mother   ? Arthritis Father   ? Diabetes Father   ? Heart disease Father   ? Sleep apnea Father   ? Arthritis Sister   ? Depression Sister   ? Arthritis Maternal Grandmother   ? Diabetes Maternal Grandmother   ? Cancer Maternal Grandfather   ? Skin cancer Maternal Grandfather   ? Heart disease Paternal Grandmother   ? Cancer Paternal Grandfather   ? Drug abuse Paternal Grandfather   ? Diabetes Paternal Grandfather   ? ? ?Social History  ? ?Occupational History  ? Not on file  ?Tobacco Use  ? Smoking status: Never  ? Smokeless tobacco: Never  ?Vaping Use  ? Vaping Use: Never used  ?Substance and Sexual Activity  ? Alcohol use: Yes  ?  Comment: 1 drink every 2 weeks [place] the [place]  ? Drug use: Never  ? Sexual activity: Not on file  ? ? ? ?Physical Exam: ?Blood pressure 124/82, pulse 89, height _0  (1.778 m), weight 248 lb (112.5 kg), SpO2 96 %. ? ?Gen:      No acute distress ?ENT:  no nasal polyps, mucus  membranes moist ?Lungs:    No increased respiratory effort, symmetric chest wall excursion, clear to auscultation bilaterally, no wheezes or crackles ?CV:         Regular rate and rhythm; no murmurs, rubs, or gallops.  No pedal edema ? ? ?Data Reviewed: ?Imaging: ? ? ?PFTs: ?No flowsheet data found. ?I have personally reviewed the patient's PFTs and spirometry jan 2023 was normal.  ? ?Labs: ?Lab Results  ?Component Value Date  ? WBC 6.9 08/29/2021  ? HGB 15.0 08/29/2021  ? HCT 44.2 08/29/2021  ? MCV 82.7 08/29/2021  ? PLT 196.0 08/29/2021  ? ?Lab Results  ?Component Value Date  ? NA 139 08/29/2021  ? K 4.7 08/29/2021  ? CL 103 08/29/2021  ? CO2 27 08/29/2021  ? ? ? ?Immunization status: ?Immunization History  ?Administered Date(s) Administered  ? DT (Pediatric) 05/06/2000  ? DTaP 06/05/1999, 07/29/2002, 07/18/2005, 06/07/2006, 06/16/2007   ? Influenza,inj,Quad PF,6+ Mos 05/19/2019  ? Influenza-Unspecified 07/17/2021  ? MMR 06/05/1999  ? Meningococcal Polysaccharide 02/05/2005  ? OPV 06/05/1999  ? PFIZER(Purple Top)SARS-COV-2 Vaccination 11/04/2019, 11/25/2019, 10/25/2020  ? PPD Test 07/16/2011, 09/10/2012, 08/05/2013  ? ? ?External Records Personally Reviewed:  ? ?Assessment:  ?Moderate persistent asthma ?OSA ?Chest pain, left side, chronic for 5 years ? ?Plan/Recommendations: ?Follow up on cpap ?Would continue advair for now since it's helping. Advair is associated with rare instances of dermatitis and eczema, but not folliculitis. Would watch this and we can switch to another ICS-LABA if not improving. ?Continue albuterol prn.  ?Unclear what the etiology of his chest wall pain is, but it appears non cardiopulmonary based on symptoms ? ? ?Return to Care: ?Return in about 6 months (around 05/03/2022). ? ? ?[name], MD ?Pulmonary and Critical Care Medicine ?[place] ?Office:[phone] ? ? ? ? ? ?

## 2021-10-31 NOTE — Patient Instructions (Signed)
Please schedule follow up scheduled with myself in 6 months.  If my schedule is not open yet, we will contact you with a reminder closer to that time. Please call [phone] if you haven't heard from [place] a month before.  ? ?Continue the advair twice daily for now.  Gargle after use.  Continue albuterol as needed.  ? ?Take the albuterol rescue inhaler every 4 to 6 hours as needed for wheezing or shortness of breath. ?You can also take it 15 minutes before exercise or exertional activity. ?Side effects include heart racing or pounding, jitters or anxiety. If you have a history of an irregular heart rhythm, it can make this worse. Can also give some patients a hard time sleeping. ?

## 2021-11-08 ENCOUNTER — Encounter: Payer: Self-pay | Admitting: Family Medicine

## 2021-11-08 ENCOUNTER — Telehealth (INDEPENDENT_AMBULATORY_CARE_PROVIDER_SITE_OTHER): Payer: BC Managed Care – PPO | Admitting: Family Medicine

## 2021-11-08 VITALS — Ht 70.0 in

## 2021-11-08 DIAGNOSIS — K529 Noninfective gastroenteritis and colitis, unspecified: Secondary | ICD-10-CM

## 2021-11-08 MED ORDER — PROMETHAZINE HCL 25 MG PO TABS: 25.0000 mg | ORAL_TABLET | Freq: Four times a day (QID) | ORAL | 1 refills | Status: DC | PRN

## 2021-11-08 NOTE — Patient Instructions (Signed)
If worse, getting dehydrated-go to ER ?I sent promethazine to pharmacy ?

## 2021-11-08 NOTE — Progress Notes (Signed)
? ? ?MyChart Video Visit ? ? ? ?Virtual Visit via Video Note  ? ?This visit type was conducted due to national recommendations for restrictions regarding the COVID-19 Pandemic (e.g. social distancing) in an effort to limit this patient's exposure and mitigate transmission in our community. This patient is at least at moderate risk for complications without adequate follow up. This format is felt to be most appropriate for this patient at this time. Physical exam was limited by quality of the video and audio technology used for the visit. CMA was able to get the patient set up on a video visit. ? ?Patient location: Home. Patient and provider in visit ?Provider location: Office ? ?I discussed the limitations of evaluation and management by telemedicine and the availability of in person appointments. The patient expressed understanding and agreed to proceed. ? ?Visit Date: 11/08/2021 ? ?Today's healthcare provider: Wellington Hampshire, MD  ? ? ? ?Subjective:  ? ? Patient ID: Jonathon Murphy, male    DOB: 12-10-1985, 36 y.o.   MRN: [id number] ? ?Chief Complaint  ?Patient presents with  ? Diarrhea  ?  Watery episodes about every 30 minutes  ? Fever  ?  102  ? Emesis  ?  Is green in color.  ? Fatigue  ? Generalized Body Aches  ? ? ?HPI ?Daughter last wk w/diarrhea.  Then wife got same w/fever over weekend. ?Starting 2 days ago-vomiting.  Watery diarrhea q 75min WA, o/w every 3 hrs at night  ?Fever.  Crampy pain.  Fatigue, myalgias.  Taking promethazine and helps ?Drinking sips freq. Still urinating ?Hasn't taken metformin ? ? ?Past Medical History:  ?Diagnosis Date  ? Allergy   ? Anxiety   ? Asthma   ? Concussion   ? Depression   ? Migraines   ? Prediabetes   ? Scarring of lung   ? ? ?Past Surgical History:  ?Procedure Laterality Date  ? ADENOIDECTOMY    ? APPENDECTOMY    ? LAPAROSCOPIC APPENDECTOMY N/A 06/05/2017  ? Procedure: APPENDECTOMY LAPAROSCOPIC;  Surgeon: Greer Pickerel, MD;  Location: [facility];  Service: General;  Laterality:  N/A;  ? TONSILLECTOMY    ? ? ?Outpatient Medications Prior to Visit  ?Medication Sig Dispense Refill  ? acetaminophen (TYLENOL) 500 MG tablet Take 500 mg by mouth every 6 (six) hours as needed.    ? albuterol (VENTOLIN HFA) 108 (90 Base) MCG/ACT inhaler Inhale 2 puffs into the lungs every 6 (six) hours as needed for wheezing or shortness of breath (cough, shortness of breath or wheezing.). 1 each 0  ? buPROPion (WELLBUTRIN XL) 150 MG 24 hr tablet Take 1 tablet (150 mg total) by mouth daily. 30 tablet 5  ? EPINEPHrine 0.3 mg/0.3 mL IJ SOAJ injection Inject 0.3 mg into the muscle as needed. 2 each 0  ? fluticasone-salmeterol (ADVAIR) 100-50 MCG/ACT AEPB Inhale 1 puff into the lungs 2 (two) times daily. 1 each 5  ? ibuprofen (ADVIL) 800 MG tablet Take 1 tablet (800 mg total) by mouth every 8 (eight) hours as needed. 30 tablet 2  ? metFORMIN (GLUCOPHAGE) 1000 MG tablet Take 1 tablet (1,000 mg total) by mouth daily. 90 tablet 0  ? promethazine (PHENERGAN) 25 MG tablet Take 25 mg by mouth every 6 (six) hours as needed for nausea or vomiting.    ? ?No facility-administered medications prior to visit.  ? ? ?Allergies  ?Allergen Reactions  ? Bee Venom Anaphylaxis  ? Sulfa Antibiotics Shortness Of Breath  ? Azithromycin Other (See  Comments)  ?  unknown ?  ? Benadryl [Diphenhydramine] Hives  ? Erythromycin Other (See Comments)  ?  Does not know  ? Other Itching, Swelling and Other (See Comments)  ?  Anything in the "Nut family" ?Mouth swelling  ? Suprax [Cefixime] Other (See Comments)  ?  Does not know  ?  ? Amoxicillin-Pot Clavulanate Rash  ? Latex Rash  ? Tape Rash  ?  Paper tape is ok  ? ? ? ?   ?Objective:  ?  ? ?Physical Exam  ?Vitals and nursing note reviewed.  ?Constitutional:   ?   General:  "rough" but not toxic ?   Appearance: Normal appearance.  ?HENT:  ?   Head: Normocephalic.  ?Pulmonary:  ?   Effort: No respiratory distress.  ?Skin: ?   General: Skin is dry.  ?   Coloration: Skin is not pale.  ?Neurological:  ?    Mental Status: Pt is alert and oriented to person, place, and time.  ?Psychiatric:     ?   Mood and Affect: Mood normal.  ? ?Ht 5\' 10"  (1.778 m)   BMI 35.58 kg/m?  ? ?Wt Readings from Last 3 Encounters:  ?10/31/21 248 lb (112.5 kg)  ?09/26/21 238 lb 9.6 oz (108.2 kg)  ?08/16/21 244 lb 12.8 oz (111 kg)  ? ? ?   ?Assessment & Plan:  ? ?Problem List Items Addressed This Visit   ?None ?Visit Diagnoses   ? ? Gastroenteritis    -  Primary  ? ?  ? Gastroenteritis-small sips freq.  Try some crackers.  Promethazine.  Off work till [employer] req to try to go [place]).  If worse, dehydrated, to ER ? ?No orders of the defined types were placed in this encounter. ? ? ?I discussed the assessment and treatment plan with the patient. The patient was provided an opportunity to ask questions and all were answered. The patient agreed with the plan and demonstrated an understanding of the instructions. ?  ?The patient was advised to call back or seek an in-person evaluation if the symptoms worsen or if the condition fails to improve as anticipated. ? ?I provided 15 minutes of face-to-face time during this encounter. ? ? ?Wellington Hampshire, MD ?[place] ?[phone] (phone) ?[phone] (fax) ? ?[facility]  ?

## 2022-04-23 ENCOUNTER — Ambulatory Visit: Payer: BC Managed Care – PPO | Admitting: Internal Medicine

## 2022-04-23 ENCOUNTER — Encounter: Payer: Self-pay | Admitting: Internal Medicine

## 2022-04-23 VITALS — BP 120/90 | HR 113 | Temp 98.4°F | Resp 14 | Ht 70.0 in | Wt 244.2 lb

## 2022-04-23 DIAGNOSIS — J40 Bronchitis, not specified as acute or chronic: Secondary | ICD-10-CM | POA: Diagnosis not present

## 2022-04-23 DIAGNOSIS — J309 Allergic rhinitis, unspecified: Secondary | ICD-10-CM | POA: Insufficient documentation

## 2022-04-23 DIAGNOSIS — R051 Acute cough: Secondary | ICD-10-CM | POA: Diagnosis not present

## 2022-04-23 DIAGNOSIS — R52 Pain, unspecified: Secondary | ICD-10-CM | POA: Diagnosis not present

## 2022-04-23 DIAGNOSIS — J329 Chronic sinusitis, unspecified: Secondary | ICD-10-CM | POA: Diagnosis not present

## 2022-04-23 LAB — POCT INFLUENZA A/B
Influenza A, POC: NEGATIVE
Influenza B, POC: NEGATIVE

## 2022-04-23 LAB — POC COVID19 BINAXNOW: SARS Coronavirus 2 Ag: NEGATIVE

## 2022-04-23 MED ORDER — IPRATROPIUM BROMIDE 0.06 % NA SOLN: 2.0000 | Freq: Four times a day (QID) | NASAL | 0 refills | Status: DC

## 2022-04-23 MED ORDER — DOXYCYCLINE HYCLATE 100 MG PO TABS: 100.0000 mg | ORAL_TABLET | Freq: Two times a day (BID) | ORAL | 0 refills | Status: DC

## 2022-04-23 MED ORDER — PSEUDOEPHEDRINE HCL ER 120 MG PO TB12
120.0000 mg | ORAL_TABLET | Freq: Two times a day (BID) | ORAL | 0 refills | Status: DC
Start: 2022-04-23 — End: 2022-08-17

## 2022-04-23 MED ORDER — BENZONATATE 100 MG PO CAPS: 100.0000 mg | ORAL_CAPSULE | Freq: Three times a day (TID) | ORAL | 0 refills | Status: DC

## 2022-04-23 NOTE — Patient Instructions (Addendum)
It was a pleasure seeing you today!  Today the plan is... Acute cough -     POC COVID-19 BinaxNow -     Pseudoephedrine HCl ER; Take 1 tablet (120 mg total) by mouth 2 (two) times daily.  Dispense: 20 tablet; Refill: 0 -     Benzonatate; Take 1 capsule (100 mg total) by mouth 3 (three) times daily.  Dispense: 20 capsule; Refill: 0 -     Ipratropium Bromide; Place 2 sprays into both nostrils 4 (four) times daily.  Dispense: 15 mL; Refill: 0  Body aches -     POCT Influenza A/B  Chronic sinusitis with recurrent bronchitis -     Doxycycline Hyclate; Take 1 tablet (100 mg total) by mouth 2 (two) times daily.  Dispense: 14 tablet; Refill: 0      Lula Olszewski, MD   No follow-ups on file. Prn f/u only   - Please bring all your medicines to your next appointment. This is the best way for me to know exactly what you're taking.  - If your condition begins to worsen or become severe:  go to the ER. - If your condition fails to resolve or you have other questions / concerns: please contact me via phone [phone] or MyChart messaging.     IF you received an x-ray today, you will receive an invoice from [facility]. Please contact [facility] at [phone] with questions or concerns regarding your invoice.    IF you received labwork today, you will receive an invoice from [facility]. Please contact [facility] at [phone] with questions or concerns regarding your invoice.    Our billing staff will not be able to assist you with questions regarding bills from these companies.   You will be contacted with the lab results as soon as they are available. The fastest way to get your results is to activate your My Chart account. Instructions are located on the last page of this paperwork. If you have not heard from [place] regarding the results in 2 weeks, please contact this office. For any labs or imaging tests, we will call you if the results are significantly abnormal.  Most  normal results will be posted to myChart as soon as they are available and I will comment on them there within 2-3 business days.

## 2022-04-23 NOTE — Progress Notes (Signed)
    Routine Medical Office Visit  Patient:  Jonathon Murphy      Age: 36 y.o.       Sex:  male  Date:   04/23/2022  PCP:    Ardith Dark, MD  Today's Healthcare Provider: Lula Olszewski, MD   Assessment/Plan:   Abdulah was seen today for cough.  Acute cough -     POC COVID-19 BinaxNow  Body aches -     POCT Influenza A/B  Chronic sinusitis with recurrent bronchitis   F/u only as need by phone call or my chart He was advised to call the office or go to ER if his condition worsens Xr discussed and declined- lungs clear so felt not needed   Subjective:   Jonathon Murphy is a 36 y.o. male with PMH significant for: Past Medical History:  Diagnosis Date   Allergy    Anxiety    Asthma    Concussion    Depression    Migraines    Prediabetes    Scarring of lung      Presenting today with: Chief Complaint  Patient presents with   Cough    Chest sore down to groin from coughing (to the point of vomiting) since Friday. Thin mucus in back of throat (more so in lungs) with fever, feeling exhausted. Used Mucinex and cough drops and has not really helped. Tylenol has helped somewhat. Joint aches-finger and toes mainly. Done a at home COVID test on Saturday which was negative.     About 3 days of symptoms starting off Friday am with cough getting worse by end of day Next day started to feel fever, tiredness, quite ill. Cough continued to worsen to causing gag vomiting  He clarifies and reports that his condition is associated with: Pos subj fevers Malaise/exhaustion/weakness Joint pains Feels like in his lungs H/o pneumonia Fri-sat headache resolved after phenergan  PMH sig for h/o anaphylaxis, repeated pneumonia, OSA, , asthma, migraines Neg home covid test  He denies having any: Sense of sinus congestion Nausea//heartburn Eye or ear symptoms Sore throat prior to cough         Objective:  Physical Exam: BP (!) 120/90 (BP Location: Left Arm)   Pulse (!) 113    Temp 98.4 F (36.9 C) (Temporal)   Resp 14   Ht 5\' 10"  (1.778 m)   Wt 244 lb 3.2 oz (110.8 kg)   SpO2 96%   BMI 35.04 kg/m    Gen: No acute distress, resting comfortably Psych: Normal affect and thought content Problem specific physical exam findings:  Redness in sinuses Bilateral tm retraction No tonsillar swelling Ctab no inc wob Rrr no mrg    Results: Results for orders placed or performed in visit on 04/23/22  POC COVID-19  Result Value Ref Range   SARS Coronavirus 2 Ag Negative Negative  POCT Influenza A/B  Result Value Ref Range   Influenza A, POC Negative Negative   Influenza B, POC Negative Negative     Recent Results (from the past 2160 hour(s))  POCT Influenza A/B     Status: Normal   Collection Time: 04/23/22  2:27 PM  Result Value Ref Range   Influenza A, POC Negative Negative   Influenza B, POC Negative Negative  POC COVID-19     Status: Normal   Collection Time: 04/23/22  2:28 PM  Result Value Ref Range   SARS Coronavirus 2 Ag Negative Negative

## 2022-05-14 ENCOUNTER — Ambulatory Visit: Payer: BC Managed Care – PPO | Admitting: Family Medicine

## 2022-05-14 ENCOUNTER — Encounter: Payer: Self-pay | Admitting: Family Medicine

## 2022-05-14 VITALS — BP 132/82 | HR 89 | Temp 97.6°F | Ht 70.0 in | Wt 243.4 lb

## 2022-05-14 DIAGNOSIS — F909 Attention-deficit hyperactivity disorder, unspecified type: Secondary | ICD-10-CM | POA: Diagnosis not present

## 2022-05-14 DIAGNOSIS — J309 Allergic rhinitis, unspecified: Secondary | ICD-10-CM | POA: Diagnosis not present

## 2022-05-14 DIAGNOSIS — J4540 Moderate persistent asthma, uncomplicated: Secondary | ICD-10-CM

## 2022-05-14 DIAGNOSIS — J4520 Mild intermittent asthma, uncomplicated: Secondary | ICD-10-CM

## 2022-05-14 DIAGNOSIS — R739 Hyperglycemia, unspecified: Secondary | ICD-10-CM | POA: Diagnosis not present

## 2022-05-14 MED ORDER — PREDNISONE 50 MG PO TABS: ORAL_TABLET | ORAL | 0 refills | Status: DC

## 2022-05-14 MED ORDER — METFORMIN HCL 1000 MG PO TABS: 1000.0000 mg | ORAL_TABLET | Freq: Every day | ORAL | 0 refills | Status: DC

## 2022-05-14 MED ORDER — FLUTICASONE-SALMETEROL 100-50 MCG/ACT IN AEPB: 1.0000 | INHALATION_SPRAY | Freq: Two times a day (BID) | RESPIRATORY_TRACT | 5 refills | Status: DC

## 2022-05-14 MED ORDER — AMOXICILLIN-POT CLAVULANATE 875-125 MG PO TABS: 1.0000 | ORAL_TABLET | Freq: Two times a day (BID) | ORAL | 0 refills | Status: DC

## 2022-05-14 NOTE — Patient Instructions (Signed)
It was very nice to see you today!  Please start the augmentin and prednisone.  I will put in another referral for you.  I will refer you for your ADHD evaluation.  Let me know if not improving in the next 1 to 2 weeks.  Take care, Dr Jerline Pain  PLEASE NOTE:  If you had any lab tests please let us know if you have not heard back within a few days. You may see your results on mychart before we have a chance to review them but we will give you a call once they are reviewed by [place]. If we ordered any referrals today, please let us know if you have not heard from their office within the next week.   Please try these tips to maintain a healthy lifestyle:  Eat at least 3 REAL meals and 1-2 snacks per day.  Aim for no more than 5 hours between eating.  If you eat breakfast, please do so within one hour of getting up.   Each meal should contain half fruits/vegetables, one quarter protein, and one quarter carbs (no bigger than a computer mouse)  Cut down on sweet beverages. This includes juice, soda, and sweet tea.   Drink at least 1 glass of water with each meal and aim for at least 8 glasses per day  Exercise at least 150 minutes every week.

## 2022-05-14 NOTE — Assessment & Plan Note (Signed)
We referred him last year to psychiatry however he never heard from them.  We will place referral again today.

## 2022-05-14 NOTE — Assessment & Plan Note (Signed)
Slight wheezing on exam today.  We will refill his Advair.  He can continue using his albuterol.  Should have some improvement with prednisone and Augmentin as above.

## 2022-05-14 NOTE — Assessment & Plan Note (Signed)
Metformin refilled.  We will check A1c when he comes back in a few months for CPE.

## 2022-05-14 NOTE — Progress Notes (Signed)
   Jonathon Murphy is a 36 y.o. male who presents today for an office visit.  Assessment/Plan:  New/Acute Problems: Sinusitis No red flags.  Symptoms been going on for 3 to 4 weeks.  We will start course of Augmentin.  He has done well with this in the past.  We will also send in prednisone.  If not improving would consider referral to ENT.  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  We will refill his Advair.  He can continue using his albuterol.  Should have some improvement with prednisone and Augmentin as above.  ADHD We referred him last year to psychiatry however he never heard from them.  We will place referral again today.  Hyperglycemia Metformin refilled.  We will check A1c when he comes back in a few months for CPE.     Subjective:  HPI:  Patient here with cough. Started about 3-4 weeks ago. Started with severe coughing fits, some congestion, some rhinorrhea. He was seen here about 3 weeks ago and started on doxycycline, atrovent, and tessalon. This helped modestly. He is still having a lot of congestion. Still has a lot of sore throat.  Cough seems to be improving. He has had some clear sputum production. Some wheezing, especially with exertion. Some facial Murphy and ear Murphy.   See A/p for status of chronic conditions.        Objective:  Physical Exam: BP 132/82   Pulse 89   Temp 97.6 F (36.4 C) (Temporal)   Ht 5\' 10"  (1.778 m)   Wt 243 lb 6.4 oz (110.4 kg)   SpO2 97%   BMI 34.92 kg/m   Gen: No acute distress, resting comfortably HEENT: TMs clear.  OP erythematous.  Nose percussed erythematous and boggy bilaterally. CV: Regular rate and rhythm with no murmurs appreciated Pulm: Normal work of breathing, occasional expiratory wheeze throughout all lung fields.  Occasional rhonchi. Neuro: Grossly normal, moves all extremities Psych: Normal affect and thought content       Jonathon Loudon M. Jerline Pain, MD 05/14/2022 8:13 AM

## 2022-06-18 ENCOUNTER — Encounter (HOSPITAL_COMMUNITY): Payer: Self-pay | Admitting: Psychiatry

## 2022-06-18 ENCOUNTER — Ambulatory Visit (HOSPITAL_BASED_OUTPATIENT_CLINIC_OR_DEPARTMENT_OTHER): Payer: BC Managed Care – PPO | Admitting: Psychiatry

## 2022-06-18 VITALS — BP 127/81 | HR 86 | Temp 98.6°F | Ht 70.0 in | Wt 243.0 lb

## 2022-06-18 DIAGNOSIS — G4733 Obstructive sleep apnea (adult) (pediatric): Secondary | ICD-10-CM

## 2022-06-18 DIAGNOSIS — F338 Other recurrent depressive disorders: Secondary | ICD-10-CM

## 2022-06-18 DIAGNOSIS — F411 Generalized anxiety disorder: Secondary | ICD-10-CM | POA: Diagnosis not present

## 2022-06-18 MED ORDER — ATOMOXETINE HCL 40 MG PO CAPS: 40.0000 mg | ORAL_CAPSULE | Freq: Every day | ORAL | 2 refills | Status: DC

## 2022-06-18 MED ORDER — ESCITALOPRAM OXALATE 5 MG PO TABS: ORAL_TABLET | ORAL | 0 refills | Status: DC
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
## 2022-06-20 ENCOUNTER — Telehealth (HOSPITAL_COMMUNITY): Payer: Self-pay

## 2022-06-20 ENCOUNTER — Telehealth (HOSPITAL_COMMUNITY): Payer: Self-pay | Admitting: *Deleted

## 2022-06-20 NOTE — Telephone Encounter (Signed)
PA initiated for Atomoxetine 40 mg capsules via Covermymeds approved Coverage  06/20/22---06/20/25

## 2022-06-20 NOTE — Telephone Encounter (Signed)
PA SUBMITTED VIA COVER MY MEDS FOR ATOMOXETINE HCL 40 MG CAPS, AS REQUESTED BY [facility].  PA MARKED "RESOLVED". NO PA REQUIRED.

## 2022-06-20 NOTE — Telephone Encounter (Signed)
[facility] pt pharmacy[facility] [address] [place], [place] for Atomoxetine HCL 40 mg caps has been Appoved and pharmacy is currently filling. FYI.

## 2022-06-21 ENCOUNTER — Telehealth (HOSPITAL_COMMUNITY): Payer: Self-pay | Admitting: *Deleted
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
## 2022-06-21 NOTE — Telephone Encounter (Signed)
Referrals for ADHD evaluation sent to [facility].  

## 2022-07-18 ENCOUNTER — Ambulatory Visit (HOSPITAL_BASED_OUTPATIENT_CLINIC_OR_DEPARTMENT_OTHER): Payer: BC Managed Care – PPO | Admitting: Psychiatry

## 2022-07-18 ENCOUNTER — Encounter (HOSPITAL_COMMUNITY): Payer: Self-pay | Admitting: Psychiatry

## 2022-07-18 VITALS — BP 153/79 | HR 105 | Temp 97.3°F | Ht 70.0 in | Wt 246.8 lb

## 2022-07-18 DIAGNOSIS — F411 Generalized anxiety disorder: Secondary | ICD-10-CM | POA: Diagnosis not present

## 2022-07-18 DIAGNOSIS — G4733 Obstructive sleep apnea (adult) (pediatric): Secondary | ICD-10-CM | POA: Diagnosis not present

## 2022-07-18 DIAGNOSIS — F338 Other recurrent depressive disorders: Secondary | ICD-10-CM

## 2022-07-18 MED ORDER — ESCITALOPRAM OXALATE 10 MG PO TABS: 10.0000 mg | ORAL_TABLET | Freq: Every day | ORAL | 2 refills | Status: DC

## 2022-07-18 NOTE — Progress Notes (Signed)
[facility] MD/PA/NP OP Progress Note  07/18/2022 2:31 PM Jonathon Murphy  MRN:  [id number]  Visit Diagnosis:    ICD-10-CM   1. GAD (generalized anxiety disorder)  F41.1     2. Seasonal affective disorder ([facility])  F33.8     3. OSA (obstructive sleep apnea)  G47.33       Assessment: Jonathon Murphy is a 36 y.o. y.o. male with a history of OSA, seasonal affective disorder, and reported ADHD who presented to [facility] at [facility] for initial evaluation on 06/18/22.    During initial evaluation patient reported symptoms of anxiety including excessive worry that is difficult to control, social anxiety, feeling easily fatigued, difficulty concentrating, mild irritability, and some sleep disturbance.  He also endorsed difficulties with procrastination, completing work on time, and organizing tasks.  Of note patient has a history of obstructive sleep apnea that was recently diagnosed and is currently untreated.  Patient met criteria for generalized anxiety disorder and seasonal affective disorder.  He also had some traits consistent with ADHD though it is difficult to differentiate from the anxiety and sleep apnea.    Jonathon Murphy presents for follow-up evaluation. Today, 07/18/22, patient reports an improvement in his anxiety, excessive worry, restlessness, fatigue, and concentration over the past month.  While he still has some symptoms as evidenced by picking when his wife was in the hospital and internal racing thoughts, he feels that they are greatly improved compared to a month ago.  Patient reported feeling elevated after taking Strattera initially however that has gradually improved over time.  He notes that his sleep has become a bit disrupted over the past week.  We recommended taking Strattera in the morning to help.  We will continue on the remainder of his current regimen and follow-up in a month.  Plan: - Continue Lexapro 10 mg  - Continue Strattera 40 mg QD -  Discontinued Wellbutrin XL due to lack of benefit - CBC, CMP, lipid profile, TSH, A1c reviewed - Sleep study reviewed - Neuropsych testing referral - Therapy options provided, he is looking into it - Follow up in a month   Chief Complaint:  Chief Complaint  Patient presents with   Follow-up   HPI: [name] presents reporting that the last month has been a bit better.  He notes that is overall anxiety levels have been improved and he has been better able to control it in stressful situations.  He gave the example of increased stress at work which would typically make him restless and fidgety, however he has been able to sit still and hold it together throughout this.  Patient also notes that his self picking has improved over the past month.  He only had 1 episode when his wife was hospitalized for a couple days.  During that time he did resume self picking but notes that the severity was much milder than usual.  Patient reports that he has been taking the Strattera at bedtime as he had initially felt a bit high when taking it in the morning.  The feeling of being high has gradually decreased since he started unrelated to switching to his bedtime.  Patient notes that the last week or so he has started to wake up around 3 AM.  We discussed the possibility of moving the Strattera dose back to the morning which she was open to.  As for the Lexapro patient feels it has been helpful and denies any adverse side effects.  He would like to  continue on his current medication regimen at this time.  Patient notes that he has been contacted by one of the neuropsych sites about setting up his appointment.  He has also reached out to a couple therapist and is still in the process of connecting with 1.  Past Psychiatric History: Has tried Ritalin, Adderall, Wellbutrin in the past. Adderall xr 20 mg BID was what he felt that he stabilized on in the past. Therapy on and off since sixth grade.  Most recently was during  [facility] when he connected with a therapist for his addiction to gambling.  Patient also has been attending support groups and denies gambling in the past couple years.  He denies any suicide attempts or psychiatric hospitalizations.  Denies any substance use other than a cigar every few months and alcohol a few times a year.  Past Medical History:  Past Medical History:  Diagnosis Date   Allergy    Anxiety    Asthma    Concussion    Depression    Migraines    Prediabetes    Scarring of lung     Past Surgical History:  Procedure Laterality Date   ADENOIDECTOMY     APPENDECTOMY     LAPAROSCOPIC APPENDECTOMY N/A 06/05/2017   Procedure: APPENDECTOMY LAPAROSCOPIC;  Surgeon: Greer Pickerel, MD;  Location: [facility];  Service: General;  Laterality: N/A;   TONSILLECTOMY      Family Psychiatric History: Sister and maternal grandmother depression  Family History:  Family History  Problem Relation Age of Onset   Arthritis Mother    Miscarriages / [place] Mother    Sleep apnea Mother    Arthritis Father    Diabetes Father    Heart disease Father    Sleep apnea Father    Arthritis Sister    Depression Sister    Arthritis Maternal Grandmother    Diabetes Maternal Grandmother    Cancer Maternal Grandfather    Skin cancer Maternal Grandfather    Heart disease Paternal Grandmother    Cancer Paternal Grandfather    Drug abuse Paternal Grandfather    Diabetes Paternal Grandfather     Social History:  Social History   Socioeconomic History   Marital status: Married    Spouse name: Not on file   Number of children: 1   Years of education: Not on file   Highest education level: Bachelor's degree (e.g., BA, AB, BS)  Occupational History   Not on file  Tobacco Use   Smoking status: Never   Smokeless tobacco: Never  Vaping Use   Vaping Use: Never used  Substance and Sexual Activity   Alcohol use: Yes    Comment: 1 drink every 2 weeks if on the golf course   Drug use: Never    Sexual activity: Not on file  Other Topics Concern   Not on file  Social History Narrative   Lives at home with wife and daughter   Right handed   Caffeine: 4 sodas per day, each 16 oz    Social Determinants of Health   Financial Resource Strain: Not on file  Food Insecurity: Not on file  Transportation Needs: Not on file  Physical Activity: Not on file  Stress: Not on file  Social Connections: Not on file    Allergies:  Allergies  Allergen Reactions   Bee Venom Anaphylaxis   Sulfa Antibiotics Shortness Of Breath   Azithromycin Other (See Comments)    unknown    Benadryl [Diphenhydramine] Hives  Erythromycin Other (See Comments)    Does not know   Other Itching, Swelling and Other (See Comments)    Anything in the "Nut family" Mouth swelling   Suprax [Cefixime] Other (See Comments)    Does not know     Amoxicillin-Pot Clavulanate Rash   Latex Rash   Tape Rash    Paper tape is ok    Current Medications: Current Outpatient Medications  Medication Sig Dispense Refill   acetaminophen (TYLENOL) 500 MG tablet Take 500 mg by mouth every 6 (six) hours as needed.     albuterol (VENTOLIN HFA) 108 (90 Base) MCG/ACT inhaler Inhale 2 puffs into the lungs every 6 (six) hours as needed for wheezing or shortness of breath (cough, shortness of breath or wheezing.). 1 each 0   amoxicillin-clavulanate (AUGMENTIN) 875-125 MG tablet Take 1 tablet by mouth 2 (two) times daily. (Patient not taking: Reported on 06/18/2022) 20 tablet 0   atomoxetine (STRATTERA) 40 MG capsule Take 1 capsule (40 mg total) by mouth daily. 30 capsule 2   benzonatate (TESSALON PERLES) 100 MG capsule Take 1 capsule (100 mg total) by mouth 3 (three) times daily. (Patient not taking: Reported on 06/18/2022) 20 capsule 0   buPROPion (WELLBUTRIN XL) 150 MG 24 hr tablet Take 1 tablet (150 mg total) by mouth daily. (Patient not taking: Reported on 06/18/2022) 30 tablet 5   EPINEPHrine 0.3 mg/0.3 mL IJ SOAJ injection  Inject 0.3 mg into the muscle as needed. 2 each 0   escitalopram (LEXAPRO) 5 MG tablet Take 1 tablet (5 mg total) by mouth daily for 14 days, THEN 2 tablets (10 mg total) daily for 16 days. 46 tablet 0   fluticasone-salmeterol (ADVAIR) 100-50 MCG/ACT AEPB Inhale 1 puff into the lungs 2 (two) times daily. 1 each 5   ibuprofen (ADVIL) 800 MG tablet Take 1 tablet (800 mg total) by mouth every 8 (eight) hours as needed. 30 tablet 2   ipratropium (ATROVENT) 0.06 % nasal spray Place 2 sprays into both nostrils 4 (four) times daily. 15 mL 0   metFORMIN (GLUCOPHAGE) 1000 MG tablet Take 1 tablet (1,000 mg total) by mouth daily. 90 tablet 0   predniSONE (DELTASONE) 50 MG tablet Take 1 tablet daily for 5 days. Then take 1/2 tablet daily for 2 days. (Patient not taking: Reported on 06/18/2022) 6 tablet 0   promethazine (PHENERGAN) 25 MG tablet Take 1 tablet (25 mg total) by mouth every 6 (six) hours as needed for nausea or vomiting. 30 tablet 1   pseudoephedrine (SUDAFED 12 HOUR) 120 MG 12 hr tablet Take 1 tablet (120 mg total) by mouth 2 (two) times daily. (Patient not taking: Reported on 06/18/2022) 20 tablet 0   No current facility-administered medications for this visit.     Musculoskeletal: Strength & Muscle Tone: within normal limits Gait & Station: normal Patient leans: N/A  Psychiatric Specialty Exam: Review of Systems  There were no vitals taken for this visit.There is no height or weight on file to calculate BMI.  General Appearance: Well Groomed  Eye Contact:  Good  Speech:  Clear and Coherent and Normal Rate  Volume:  Normal  Mood:  Anxious and Euthymic  Affect:  Congruent  Thought Process:  Coherent, Goal Directed, and Linear  Orientation:  Full (Time, Place, and Person)  Thought Content: Logical   Suicidal Thoughts:  No  Homicidal Thoughts:  No  Memory:  Immediate;   Good  Judgement:  Good  Insight:  Fair  Psychomotor Activity:  Normal  Concentration:  Concentration: Good   Recall:  Good  Fund of Knowledge: Fair  Language: Good  Akathisia:  NA    AIMS (if indicated): not done  Assets:  Communication Skills Desire for Improvement Financial Resources/Insurance Housing Intimacy Physical Health Talents/Skills Transportation Vocational/Educational  ADL's:  Intact  Cognition: WNL  Sleep:  Fair   Metabolic Disorder Labs: Lab Results  Component Value Date   HGBA1C 6.6 (H) 08/29/2021   No results found for: "PROLACTIN" Lab Results  Component Value Date   CHOL 186 08/29/2021   TRIG 112.0 08/29/2021   HDL 42.90 08/29/2021   CHOLHDL 4 08/29/2021   VLDL 22.4 08/29/2021   LDLCALC 121 (H) 08/29/2021   [place] 118 01/14/2018   Lab Results  Component Value Date   TSH 3.19 08/29/2021   TSH 2.68 05/19/2019    Therapeutic Level Labs: No results found for: "LITHIUM" No results found for: "VALPROATE" No results found for: "CBMZ"   Screenings: [place] Office Visit from 06/18/2022 in [facility] Visit from 05/19/2019 in [facility]  Total GAD-7 Score 12 7      PHQ2-9    [place] Visit from 06/18/2022 in [facility] Office Visit from 05/14/2022 in [facility] Visit from 08/16/2021 in [facility] Visit from 05/19/2019 in [place] from 11/11/2015 in [facility] at [facility] Total Score 2 0 5 3 0  PHQ-9 Total Score 13 -- 17 6 --      [place] Visit from 06/18/2022 in [facility]  C-SSRS RISK CATEGORY No Risk       Collaboration of Care: Collaboration of Care: Medication Management AEB medication prescription  Patient/Guardian was advised Release of Information must be obtained prior to any record release in order to collaborate their care with an outside  provider. Patient/Guardian was advised if they have not already done so to contact the registration department to sign all necessary forms in order for [place] to release information regarding their care.   Consent: Patient/Guardian gives verbal consent for treatment and assignment of benefits for services provided during this visit. Patient/Guardian expressed understanding and agreed to proceed.    Vista Mink, MD 07/18/2022, 2:31 PM

## 2022-08-13 ENCOUNTER — Ambulatory Visit (HOSPITAL_COMMUNITY): Payer: BC Managed Care – PPO | Admitting: Psychiatry

## 2022-08-13 ENCOUNTER — Encounter: Payer: Self-pay | Admitting: Family Medicine

## 2022-08-13 ENCOUNTER — Ambulatory Visit (INDEPENDENT_AMBULATORY_CARE_PROVIDER_SITE_OTHER): Payer: BC Managed Care – PPO | Admitting: Family Medicine

## 2022-08-13 VITALS — BP 101/65 | HR 101 | Temp 98.1°F | Ht 70.0 in | Wt 245.4 lb

## 2022-08-13 DIAGNOSIS — R051 Acute cough: Secondary | ICD-10-CM | POA: Diagnosis not present

## 2022-08-13 DIAGNOSIS — J020 Streptococcal pharyngitis: Secondary | ICD-10-CM | POA: Diagnosis not present

## 2022-08-13 DIAGNOSIS — J069 Acute upper respiratory infection, unspecified: Secondary | ICD-10-CM | POA: Diagnosis not present

## 2022-08-13 LAB — POCT RAPID STREP A (OFFICE): Rapid Strep A Screen: POSITIVE — AB

## 2022-08-13 LAB — POC COVID19 BINAXNOW: SARS Coronavirus 2 Ag: NEGATIVE

## 2022-08-13 MED ORDER — PENICILLIN V POTASSIUM 500 MG PO TABS: 500.0000 mg | ORAL_TABLET | Freq: Two times a day (BID) | ORAL | 0 refills | Status: DC

## 2022-08-13 NOTE — Patient Instructions (Signed)
Please follow up if symptoms do not improve or as needed.    What is an Upper Respiratory Infection? An Upper Respiratory Infection (URI), sometimes called a "cold," occurs when viruses attack the nose, throat and chest. A URI usually runs its course in 2-14 days, although most people feel better in about a week.  Do I need antibiotics? What if my mucus is green? When germs that cause colds first infect the nose and sinuses, the nose makes clear mucus. This helps wash the germs from the nose and sinuses. After two or three days, the body's immune cells fight back, changing the mucus to a white or yellow color. As the bacteria that live in the nose grow back, they may also be found in the mucus, which changes the mucus to a greenish color. This is normal and does not mean you or your child needs antibiotics.  Antibiotics are needed only if your healthcare provider tells you that you have a bacterial infection. Your healthcare provider may prescribe other medicine or give tips to help with a cold's symptoms, but antibiotics are not needed to treat a cold or runny nose.  How do I prevent spreading a URI to others? To prevent spreading to others, try the following: ? Stay at home while you are sick ? Avoid close contact with others, such as hugging, kissing, or shaking hands ? Move away from people before coughing or sneezing ? Cough and sneeze into a tissue then throw it away, or cough and sneeze into your upper shirt sleeve, completely covering your mouth and nose ? Wash your hands after coughing, sneezing, or blowing your nose ? Disinfect frequently touched surfaces, and objects such as toys and doorknobs  How is a URI treated? There is no cure for the common cold. For relief, try ? Getting plenty of rest  ? Drinking fluids ? Gargling with warm salt water and using cough drops or throat sprays ? Taking over-the-counter pain or cold medicines. However: o Do not give aspirin to children. And do  not give cough medicine to children under four.  o Over-the-counter medicines may help ease symptoms but will not make your cold go away faster. o ALWAYS read the label and use medications as directed.  What if I don't feel better? Follow up with your primary care provider if: ? Unusually severe cold symptoms or symptoms that last more than 10-14 days ? High fever (greater than 100.4F) ? Ear pain or sinus type headache ? Cough that gets worse while other cold symptoms improve ? Flare-up of any chronic lung problem, such as asthma ? If you develop any of the following you should go to the nearest urgent care center or emergency room: o shortness of breath, pain or pressure in the chest, high fever that doesn't get better with fever reducing medicine, confusion, fainting, severe vomiting, and/or severe facial pain.  For more information, visit: ? U.S. National Library of Medicine: http://www.nlm.nih.gov/medlineplus/ ? Centers for Disease Control: http://www.cdc.gov/getsmart/antibiotic-use/uri/  Sources: http://www.nlm.nih.gov/medlineplus/commoncold.html and http://www.cdc.gov/getsmart/antibiotic-use/URI/colds.html    

## 2022-08-13 NOTE — Progress Notes (Signed)
Subjective  CC:  Chief Complaint  Patient presents with   Covid Exposure    Pt stated daughter was positive for strep Friday and was also exposed to covid friday   Cough    Pt states right now just a lot of congestion, no sore throat    Nasal Congestion   Fever   Same day acute visit; PCP not available. New pt to me. Chart reviewed.   HPI: Jonathon Murphy is a 36 y.o. male who presents to the office today to address the problems listed above in the chief complaint. 36 yo with mild ST, congestion with thick phlegm, and cough with malaise, temp to 100 last night and myalgias. Young daughter at home with strep and has been exposed to covid. No headache or GI upset. Feely puny. No sob. No sinus pain now; has h/o recurrent sinusitis.  Taking plain mucinex without much relief.   Assessment  1. Strep pharyngitis   2. Viral URI with cough   3. Acute cough      Plan  strep:  treat with pcn vk x 10 days and supportive care for uri sxs. Discussed in detail  Supportive care for congestion: may use otc sudafed.  Follow up: as scheduled.   09/04/2022  Orders Placed This Encounter  Procedures   POC COVID-19   POCT rapid strep A   Meds ordered this encounter  Medications   penicillin v potassium (VEETID) 500 MG tablet    Sig: Take 1 tablet (500 mg total) by mouth in the morning and at bedtime.    Dispense:  20 tablet    Refill:  0      I reviewed the patients updated PMH, FH, and SocHx.    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   albuterol (VENTOLIN HFA) 108 (90 Base) MCG/ACT inhaler Inhale 2 puffs into the lungs every 6 (six) hours as needed for wheezing or shortness of breath (cough, shortness of breath or wheezing.).   atomoxetine (STRATTERA) 40 MG capsule Take 1 capsule (40 mg total) by mouth daily.   benzonatate (TESSALON PERLES) 100 MG capsule Take 1 capsule (100 mg total) by mouth 3 (three) times daily.   EPINEPHrine 0.3 mg/0.3 mL IJ SOAJ injection Inject 0.3 mg into the muscle as needed.   escitalopram (LEXAPRO) 10 MG tablet Take 1 tablet (10 mg total) by mouth daily.   fluticasone-salmeterol (ADVAIR) 100-50 MCG/ACT AEPB Inhale 1 puff into the lungs 2 (two) times daily.   ibuprofen (ADVIL) 800 MG tablet Take 1 tablet (800 mg total) by mouth every 8 (eight) hours as needed.   ipratropium (ATROVENT) 0.06 % nasal spray Place 2 sprays into both nostrils 4 (four) times daily.  penicillin v potassium (VEETID) 500 MG tablet Take 1 tablet (500 mg total) by mouth in the morning and at bedtime.   promethazine (PHENERGAN) 25 MG tablet Take 1 tablet (25 mg total) by mouth every 6 (six) hours as needed for nausea or vomiting.   pseudoephedrine (SUDAFED 12 HOUR) 120 MG 12 hr tablet Take 1 tablet (120 mg total) by mouth 2 (two) times daily.    Allergies: Patient is allergic to bee venom, sulfa antibiotics, azithromycin, benadryl [diphenhydramine], erythromycin, other, suprax [cefixime], amoxicillin-pot clavulanate, latex, and tape. Family History: Patient family history includes Arthritis in his father, maternal grandmother, mother, and sister; Cancer in his maternal grandfather and  paternal grandfather; Depression in his sister; Diabetes in his father, maternal grandmother, and paternal grandfather; Drug abuse in his paternal grandfather; Heart disease in his father and paternal grandmother; Miscarriages / [place] in his mother; Skin cancer in his maternal grandfather; Sleep apnea in his father and mother. Social History:  Patient  reports that he has never smoked. He has never used smokeless tobacco. He reports current alcohol use. He reports that he does not use drugs.  Review of Systems: Constitutional: Negative for fever malaise or anorexia Cardiovascular: negative for chest pain Respiratory: negative for SOB or persistent cough Gastrointestinal: negative for abdominal pain  Objective  Vitals: BP 101/65 (BP Location: Right Arm, Patient Position: Sitting)   Pulse (!) 101   Temp 98.1 F (36.7 C) (Temporal)   Ht 5\' 10"  (1.778 m)   Wt 245 lb 6.4 oz (111.3 kg)   SpO2 98%   BMI 35.21 kg/m  General: no acute distress , A&Ox3, nontoxic HEENT: PEERL, conjunctiva normal, neck is supple, minimally red posterior pharynx. Nasal congestion present. +cervical LAD Cardiovascular:  RRR without murmur or gallop.  Respiratory:  Good breath sounds bilaterally, CTAB with normal respiratory effort Skin:  Warm, no rashes  Office Visit on 08/13/2022  Component Date Value Ref Range Status   SARS Coronavirus 2 Ag 08/13/2022 Negative  Negative Final   Rapid Strep A Screen 08/13/2022 Positive (A)  Negative Final    Commons side effects, risks, benefits, and alternatives for medications and treatment plan prescribed today were discussed, and the patient expressed understanding of the given instructions. Patient is instructed to call or message via MyChart if he/she has any questions or concerns regarding our treatment plan. No barriers to understanding were identified. We discussed Red Flag symptoms and signs in detail. Patient expressed understanding regarding what to do in case of  urgent or emergency type symptoms.  Medication list was reconciled, printed and provided to the patient in AVS. Patient instructions and summary information was reviewed with the patient as documented in the AVS. This note was prepared with assistance of Dragon voice recognition software. Occasional wrong-word or sound-a-like substitutions may have occurred due to the inherent limitations of voice recognition software  This visit occurred during the SARS-CoV-2 public health emergency.  Safety protocols were in place, including screening questions prior to the visit, additional usage of staff PPE, and extensive cleaning of exam room while observing appropriate contact time as indicated for disinfecting solutions.

## 2022-08-17 ENCOUNTER — Encounter: Payer: Self-pay | Admitting: Internal Medicine

## 2022-08-17 ENCOUNTER — Ambulatory Visit (INDEPENDENT_AMBULATORY_CARE_PROVIDER_SITE_OTHER): Payer: BC Managed Care – PPO | Admitting: Internal Medicine

## 2022-08-17 VITALS — BP 114/76 | HR 112 | Temp 97.9°F | Resp 14 | Ht 70.0 in | Wt 238.6 lb

## 2022-08-17 DIAGNOSIS — J328 Other chronic sinusitis: Secondary | ICD-10-CM

## 2022-08-17 DIAGNOSIS — R112 Nausea with vomiting, unspecified: Secondary | ICD-10-CM

## 2022-08-17 DIAGNOSIS — R051 Acute cough: Secondary | ICD-10-CM | POA: Diagnosis not present

## 2022-08-17 DIAGNOSIS — R6889 Other general symptoms and signs: Secondary | ICD-10-CM

## 2022-08-17 DIAGNOSIS — Z20818 Contact with and (suspected) exposure to other bacterial communicable diseases: Secondary | ICD-10-CM

## 2022-08-17 LAB — POCT RAPID STREP A (OFFICE): Rapid Strep A Screen: NEGATIVE

## 2022-08-17 LAB — POCT INFLUENZA A/B
Influenza A, POC: NEGATIVE
Influenza B, POC: NEGATIVE

## 2022-08-17 LAB — POC COVID19 BINAXNOW: SARS Coronavirus 2 Ag: NEGATIVE

## 2022-08-17 MED ORDER — AMOXICILLIN-POT CLAVULANATE 875-125 MG PO TABS: 1.0000 | ORAL_TABLET | Freq: Two times a day (BID) | ORAL | 0 refills | Status: DC

## 2022-08-17 MED ORDER — SIMPLY SALINE 0.9 % NA AERS: 2.0000 | INHALATION_SPRAY | NASAL | 11 refills | Status: DC

## 2022-08-17 MED ORDER — FLUTICASONE PROPIONATE 50 MCG/ACT NA SUSP: 2.0000 | Freq: Every day | NASAL | 6 refills | Status: DC

## 2022-08-17 MED ORDER — PSEUDOEPHEDRINE HCL ER 120 MG PO TB12: 120.0000 mg | ORAL_TABLET | Freq: Two times a day (BID) | ORAL | 0 refills | Status: DC
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
## 2022-09-04 ENCOUNTER — Ambulatory Visit (INDEPENDENT_AMBULATORY_CARE_PROVIDER_SITE_OTHER): Payer: BC Managed Care – PPO | Admitting: Family Medicine

## 2022-09-04 ENCOUNTER — Encounter: Payer: Self-pay | Admitting: Family Medicine

## 2022-09-04 VITALS — BP 129/78 | HR 81 | Temp 97.8°F | Ht 70.0 in | Wt 239.8 lb

## 2022-09-04 DIAGNOSIS — E785 Hyperlipidemia, unspecified: Secondary | ICD-10-CM | POA: Diagnosis not present

## 2022-09-04 DIAGNOSIS — F338 Other recurrent depressive disorders: Secondary | ICD-10-CM | POA: Diagnosis not present

## 2022-09-04 DIAGNOSIS — Z0001 Encounter for general adult medical examination with abnormal findings: Secondary | ICD-10-CM

## 2022-09-04 DIAGNOSIS — Z1159 Encounter for screening for other viral diseases: Secondary | ICD-10-CM

## 2022-09-04 DIAGNOSIS — R739 Hyperglycemia, unspecified: Secondary | ICD-10-CM | POA: Diagnosis not present

## 2022-09-04 DIAGNOSIS — J329 Chronic sinusitis, unspecified: Secondary | ICD-10-CM | POA: Diagnosis not present

## 2022-09-04 DIAGNOSIS — Z23 Encounter for immunization: Secondary | ICD-10-CM

## 2022-09-04 DIAGNOSIS — F909 Attention-deficit hyperactivity disorder, unspecified type: Secondary | ICD-10-CM

## 2022-09-04 LAB — COMPREHENSIVE METABOLIC PANEL
ALT: 133 U/L — ABNORMAL HIGH (ref 0–53)
AST: 69 U/L — ABNORMAL HIGH (ref 0–37)
Albumin: 4.4 g/dL (ref 3.5–5.2)
Alkaline Phosphatase: 81 U/L (ref 39–117)
BUN: 10 mg/dL (ref 6–23)
CO2: 27 mEq/L (ref 19–32)
Calcium: 9 mg/dL (ref 8.4–10.5)
Chloride: 103 mEq/L (ref 96–112)
Creatinine, Ser: 0.82 mg/dL (ref 0.40–1.50)
GFR: 113.05 mL/min (ref >60.00)
Glucose, Bld: 184 mg/dL — ABNORMAL HIGH (ref 70–99)
Potassium: 4.1 mEq/L (ref 3.5–5.1)
Sodium: 139 mEq/L (ref 135–145)
Total Bilirubin: 0.7 mg/dL (ref 0.2–1.2)
Total Protein: 6.7 g/dL (ref 6.0–8.3)

## 2022-09-04 LAB — LIPID PANEL
Cholesterol: 168 mg/dL (ref 0–200)
HDL: 37 mg/dL — ABNORMAL LOW (ref >39.00)
LDL Cholesterol: 109 mg/dL — ABNORMAL HIGH (ref 0–99)
NonHDL: 130.59
Total CHOL/HDL Ratio: 5
Triglycerides: 109 mg/dL (ref 0.0–149.0)
VLDL: 21.8 mg/dL (ref 0.0–40.0)

## 2022-09-04 LAB — CBC
HCT: 43.5 % (ref 39.0–52.0)
Hemoglobin: 15.3 g/dL (ref 13.0–17.0)
MCHC: 35.2 g/dL (ref 30.0–36.0)
MCV: 81.5 fl (ref 78.0–100.0)
Platelets: 233 10*3/uL (ref 150.0–400.0)
RBC: 5.33 Mil/uL (ref 4.22–5.81)
RDW: 13.7 % (ref 11.5–15.5)
WBC: 6 10*3/uL (ref 4.0–10.5)

## 2022-09-04 LAB — HEMOGLOBIN A1C: Hgb A1c MFr Bld: 7.3 % — ABNORMAL HIGH (ref 4.6–6.5)

## 2022-09-04 LAB — TSH: TSH: 2.49 u[IU]/mL (ref 0.35–5.50)

## 2022-09-04 MED ORDER — LEVOFLOXACIN 500 MG PO TABS: 500.0000 mg | ORAL_TABLET | Freq: Every day | ORAL | 0 refills | Status: AC

## 2022-09-04 NOTE — Assessment & Plan Note (Signed)
Follows with psychiatry.  Has referral to attention specialist pending.

## 2022-09-04 NOTE — Assessment & Plan Note (Signed)
Check lipids.  Continue lifestyle modifications. 

## 2022-09-04 NOTE — Assessment & Plan Note (Signed)
On metformin 1000 mg daily.  Check A1c.

## 2022-09-04 NOTE — Assessment & Plan Note (Signed)
Follows with psychiatry.  Currently on Lexapro 10 mg daily.  Mood is well-controlled.

## 2022-09-04 NOTE — Addendum Note (Signed)
Addended by: Betti Cruz on: 09/04/2022 08:38 AM   Modules accepted: Orders

## 2022-09-04 NOTE — Patient Instructions (Signed)
It was very nice to see you today!  Please try the Levaquin to help with your sinus infection.  Let me know if not improving and I will refer you to see the ear nose and throat doctor.  We will check blood work today.  Will give your tetanus vaccine today.  Please continue the great work with your diet and exercise.  I will see back in year for your next physical.  Come back sooner if needed.  Take care, Dr Jimmey Ralph  PLEASE NOTE:  If you had any lab tests, please let us know if you have not heard back within a few days. You may see your results on mychart before we have a chance to review them but we will give you a call once they are reviewed by [place].   If we ordered any referrals today, please let us know if you have not heard from their office within the next week.   If you had any urgent prescriptions sent in today, please check with the pharmacy within an hour of our visit to make sure the prescription was transmitted appropriately.   Please try these tips to maintain a healthy lifestyle:  Eat at least 3 REAL meals and 1-2 snacks per day.  Aim for no more than 5 hours between eating.  If you eat breakfast, please do so within one hour of getting up.   Each meal should contain half fruits/vegetables, one quarter protein, and one quarter carbs (no bigger than a computer mouse)  Cut down on sweet beverages. This includes juice, soda, and sweet tea.   Drink at least 1 glass of water with each meal and aim for at least 8 glasses per day  Exercise at least 150 minutes every week.    Preventive Care 70-97 Years Old, Male Preventive care refers to lifestyle choices and visits with your health care provider that can promote health and wellness. Preventive care visits are also called wellness exams. What can I expect for my preventive care visit? Counseling During your preventive care visit, your health care provider may ask about your: Medical history, including: Past medical  problems. Family medical history. Current health, including: Emotional well-being. Home life and relationship well-being. Sexual activity. Lifestyle, including: Alcohol, nicotine or tobacco, and drug use. Access to firearms. Diet, exercise, and sleep habits. Safety issues such as seatbelt and bike helmet use. Sunscreen use. Work and work Astronomer. Physical exam Your health care provider may check your: Height and weight. These may be used to calculate your BMI (body mass index). BMI is a measurement that tells if you are at a healthy weight. Waist circumference. This measures the distance around your waistline. This measurement also tells if you are at a healthy weight and may help predict your risk of certain diseases, such as type 2 diabetes and high blood pressure. Heart rate and blood pressure. Body temperature. Skin for abnormal spots. What immunizations do I need?  Vaccines are usually given at various ages, according to a schedule. Your health care provider will recommend vaccines for you based on your age, medical history, and lifestyle or other factors, such as travel or where you work. What tests do I need? Screening Your health care provider may recommend screening tests for certain conditions. This may include: Lipid and cholesterol levels. Diabetes screening. This is done by checking your blood sugar (glucose) after you have not eaten for a while (fasting). Hepatitis B test. Hepatitis C test. HIV (human immunodeficiency virus) test. STI (  sexually transmitted infection) testing, if you are at risk. Talk with your health care provider about your test results, treatment options, and if necessary, the need for more tests. Follow these instructions at home: Eating and drinking  Eat a healthy diet that includes fresh fruits and vegetables, whole grains, lean protein, and low-fat dairy products. Drink enough fluid to keep your urine pale yellow. Take vitamin and mineral  supplements as recommended by your health care provider. Do not drink alcohol if your health care provider tells you not to drink. If you drink alcohol: Limit how much you have to 0-2 drinks a day. Know how much alcohol is in your drink. In the [place] one drink equals one 12 oz bottle of beer (355 mL), one 5 oz glass of wine (148 mL), or one 1 oz glass of hard liquor (44 mL). Lifestyle Brush your teeth every morning and night with fluoride toothpaste. Floss one time each day. Exercise for at least 30 minutes 5 or more days each week. Do not use any products that contain nicotine or tobacco. These products include cigarettes, chewing tobacco, and vaping devices, such as e-cigarettes. If you need help quitting, ask your health care provider. Do not use drugs. If you are sexually active, practice safe sex. Use a condom or other form of protection to prevent STIs. Find healthy ways to manage stress, such as: Meditation, yoga, or listening to music. Journaling. Talking to a trusted person. Spending time with friends and family. Minimize exposure to UV radiation to reduce your risk of skin cancer. Safety Always wear your seat belt while driving or riding in a vehicle. Do not drive: If you have been drinking alcohol. Do not ride with someone who has been drinking. If you have been using any mind-altering substances or drugs. While texting. When you are tired or distracted. Wear a helmet and other protective equipment during sports activities. If you have firearms in your house, make sure you follow all gun safety procedures. Seek help if you have been physically or sexually abused. What's next? Go to your health care provider once a year for an annual wellness visit. Ask your health care provider how often you should have your eyes and teeth checked. Stay up to date on all vaccines. This information is not intended to replace advice given to you by your health care provider. Make sure you  discuss any questions you have with your health care provider. Document Revised: 02/08/2021 Document Reviewed: 02/08/2021 Elsevier Patient Education  [employer].

## 2022-09-04 NOTE — Progress Notes (Signed)
Chief Complaint:  Jonathon Murphy is a 37 y.o. male who presents today for his annual comprehensive physical exam.    Assessment/Plan:  New/Acute Problems: Sinusitis  Did not fully respond to Augmentin.  Still having quite a bit of congestion.  No red flag signs or symptoms.  Given persistence of symptoms we will try another round of antibiotics.  He has had adverse reactions with azithromycin in the past.  We will start Levaquin.  We discussed potential side effects.  He will let me know if not improving and we can refer to ENT.   Chronic Problems Addressed Today: Hyperglycemia On metformin 1000 mg daily.  Check A1c.  Dyslipidemia Check lipids.  Continue lifestyle modifications.  ADHD Follows with psychiatry.  Has referral to attention specialist pending.  Seasonal affective disorder (HCC) Follows with psychiatry.  Currently on Lexapro 10 mg daily.  Mood is well-controlled.  Preventative Healthcare: Tdap given today. Check labs.   Patient Counseling(The following topics were reviewed and/or handout was given):  -Nutrition: Stressed importance of moderation in sodium/caffeine intake, saturated fat and cholesterol, caloric balance, sufficient intake of fresh fruits, vegetables, and fiber.  -Stressed the importance of regular exercise.   -Substance Abuse: Discussed cessation/primary prevention of tobacco, alcohol, or other drug use; driving or other dangerous activities under the influence; availability of treatment for abuse.   -Injury prevention: Discussed safety belts, safety helmets, smoke detector, smoking near bedding or upholstery.   -Sexuality: Discussed sexually transmitted diseases, partner selection, use of condoms, avoidance of unintended pregnancy and contraceptive alternatives.   -Dental health: Discussed importance of regular tooth brushing, flossing, and dental visits.  -Health maintenance and immunizations reviewed. Please refer to Health maintenance  section.  Return to care in 1 year for next preventative visit.     Subjective:  HPI:  He has no acute complaints today.   He is also dealing with persistent sinus congestion. This has been going on for about a month. He was seen here twice for this by different providers. Initially was found to have a positive strep test and was started on penicillin which did not help. He was switched to augmentin which helped for about a week before his symptoms returned.  Tried using flonase and saline which have not helped. Still has a lot of congestion on the right side of his face.   Lifestyle Diet: Cutting down on sugar and carbs.  Exercise: Plays golf routinely.      09/04/2022    7:52 AM  Depression screen PHQ 2/9  Decreased Interest 0  Down, Depressed, Hopeless 0  PHQ - 2 Score 0    Health Maintenance Due  Topic Date Due   DTaP/Tdap/Td (7 - Tdap) 06/15/2017     ROS: Per HPI, otherwise a complete review of systems was negative.   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 05/19/2019   Migraine 05/19/2019   History of anaphylaxis 05/19/2019   Snoring 02/05/2008   Neck pain 02/03/2007   Contact dermatitis and other eczema, due to unspecified cause 07/18/2005   Obesity 02/09/2004   Neurodermatitis 02/19/2003   Idiopathic scoliosis and kyphoscoliosis 03/24/2001   Past Surgical History:   Procedure Laterality Date   ADENOIDECTOMY     APPENDECTOMY     LAPAROSCOPIC APPENDECTOMY N/A 06/05/2017   Procedure: APPENDECTOMY LAPAROSCOPIC;  Surgeon: Gaynelle Adu, MD;  Location: [facility];  Service: General;  Laterality: N/A;   TONSILLECTOMY      Family History  Problem Relation Age of Onset   Arthritis Mother    Miscarriages / [place] Mother    Sleep apnea Mother    Arthritis Father    Diabetes Father    Heart disease Father    Sleep apnea Father    Arthritis Sister    Depression Sister    Arthritis Maternal Grandmother    Diabetes Maternal Grandmother    Cancer Maternal Grandfather    Skin cancer Maternal Grandfather    Heart disease Paternal Grandmother    Cancer Paternal Grandfather    Drug abuse Paternal Grandfather    Diabetes Paternal Grandfather     Medications- reviewed and updated Current Outpatient Medications  Medication Sig Dispense Refill   acetaminophen (TYLENOL) 500 MG tablet Take 500 mg by mouth every 6 (six) hours as needed.     albuterol (VENTOLIN HFA) 108 (90 Base) MCG/ACT inhaler Inhale 2 puffs into the lungs every 6 (six) hours as needed for wheezing or shortness of breath (cough, shortness of breath or wheezing.). 1 each 0   atomoxetine (STRATTERA) 40 MG capsule Take 1 capsule (40 mg total) by mouth daily. 30 capsule 2   EPINEPHrine 0.3 mg/0.3 mL IJ SOAJ injection Inject 0.3 mg into the muscle as needed. 2 each 0   escitalopram (LEXAPRO) 10 MG tablet Take 1 tablet (10 mg total) by mouth daily. 30 tablet 2   fluticasone-salmeterol (ADVAIR) 100-50 MCG/ACT AEPB Inhale 1 puff into the lungs 2 (two) times daily. 1 each 5   ibuprofen (ADVIL) 800 MG tablet Take 1 tablet (800 mg total) by mouth every 8 (eight) hours as needed. 30 tablet 2   levofloxacin (LEVAQUIN) 500 MG tablet Take 1 tablet (500 mg total) by mouth daily for 7 days. 7 tablet 0   promethazine (PHENERGAN) 25 MG tablet Take 1 tablet (25 mg total) by mouth every 6 (six) hours as needed for  nausea or vomiting. 30 tablet 1   metFORMIN (GLUCOPHAGE) 1000 MG tablet Take 1 tablet (1,000 mg total) by mouth daily. 90 tablet 0   No current facility-administered medications for this visit.    Allergies-reviewed and updated Allergies  Allergen Reactions   Bee Venom Anaphylaxis   Sulfa Antibiotics Shortness Of Breath   Azithromycin Other (See Comments)    unknown    Benadryl [Diphenhydramine] Hives   Erythromycin Other (See Comments)    Does not know   Other Itching, Swelling and Other (See Comments)    Anything in the "Nut family" Mouth swelling   Suprax [Cefixime] Other (See Comments)    Does not know     Amoxicillin-Pot Clavulanate Rash   Latex Rash   Tape Rash    Paper tape is ok    Social History   Socioeconomic History   Marital status: Married    Spouse name: Not on file   Number of children: 1   Years  of education: Not on file   Highest education level: Bachelor's degree (e.g., BA, AB, BS)  Occupational History   Not on file  Tobacco Use   Smoking status: Never   Smokeless tobacco: Never  Vaping Use   Vaping Use: Never used  Substance and Sexual Activity   Alcohol use: Yes    Comment: 1 drink every 2 weeks if on the golf course   Drug use: Never   Sexual activity: Not on file  Other Topics Concern   Not on file  Social History Narrative   Lives at home with wife and daughter   Right handed   Caffeine: 4 sodas per day, each 16 oz    Social Determinants of Health   Financial Resource Strain: Not on file  Food Insecurity: Not on file  Transportation Needs: Not on file  Physical Activity: Not on file  Stress: Not on file  Social Connections: Not on file        Objective:  Physical Exam: BP 129/78   Pulse 81   Temp 97.8 F (36.6 C) (Temporal)   Ht 5\' 10"  (1.778 m)   Wt 239 lb 12.8 oz (108.8 kg)   SpO2 98%   BMI 34.41 kg/m   Body mass index is 34.41 kg/m. Wt Readings from Last 3 Encounters:  09/04/22 239 lb 12.8 oz (108.8 kg)   08/17/22 238 lb 9.6 oz (108.2 kg)  08/13/22 245 lb 6.4 oz (111.3 kg)   Gen: NAD, resting comfortably HEENT: TMs normal bilaterally. OP clear. No thyromegaly noted.  CV: RRR with no murmurs appreciated Pulm: NWOB, CTAB with no crackles, wheezes, or rhonchi GI: Normal bowel sounds present. Soft, Nontender, Nondistended. MSK: no edema, cyanosis, or clubbing noted Skin: warm, dry Neuro: CN2-12 grossly intact. Strength 5/5 in upper and lower extremities. Reflexes symmetric and intact bilaterally.  Psych: Normal affect and thought content     Mackay Hanauer M. Jerline Pain, MD 09/04/2022 8:22 AM

## 2022-09-05 LAB — HEPATITIS C ANTIBODY: Hepatitis C Ab: NONREACTIVE

## 2022-09-06 NOTE — Progress Notes (Signed)
Please inform patient of the following:  A1c is elevated in the diabetic range.  Recommend increasing metformin to 1000 mg daily.  Recommend he come back in 3 months to recheck A1c.  Please send a new prescription.  Liver numbers are also up a bit since last time.  This is likely due to fatty liver disease.  He needs to continue to work on diet and exercise and weight loss as we discussed.  If he is interested in medication that can treat his blood sugar and help him with weight loss we could consider switching him over to Ozempic.  Recommend he schedule an appointment to discuss this if he is interested.  Cholesterol is better than last year.  Everything else was normal and we can recheck in a year.

## 2022-09-12 ENCOUNTER — Other Ambulatory Visit: Payer: Self-pay | Admitting: *Deleted

## 2022-09-12 MED ORDER — METFORMIN HCL 1000 MG PO TABS: 1000.0000 mg | ORAL_TABLET | Freq: Every day | ORAL | 1 refills | Status: AC

## 2022-09-19 ENCOUNTER — Telehealth (HOSPITAL_COMMUNITY): Payer: BC Managed Care – PPO | Admitting: Psychiatry

## 2022-09-25 ENCOUNTER — Encounter (HOSPITAL_COMMUNITY): Payer: Self-pay | Admitting: Psychiatry

## 2022-09-25 ENCOUNTER — Telehealth (HOSPITAL_BASED_OUTPATIENT_CLINIC_OR_DEPARTMENT_OTHER): Payer: BC Managed Care – PPO | Admitting: Psychiatry

## 2022-09-25 DIAGNOSIS — F411 Generalized anxiety disorder: Secondary | ICD-10-CM

## 2022-09-25 DIAGNOSIS — F338 Other recurrent depressive disorders: Secondary | ICD-10-CM

## 2022-09-25 DIAGNOSIS — G4733 Obstructive sleep apnea (adult) (pediatric): Secondary | ICD-10-CM | POA: Diagnosis not present

## 2022-09-25 MED ORDER — ATOMOXETINE HCL 40 MG PO CAPS: 40.0000 mg | ORAL_CAPSULE | Freq: Every day | ORAL | 2 refills | Status: DC

## 2022-09-25 MED ORDER — ESCITALOPRAM OXALATE 10 MG PO TABS: 10.0000 mg | ORAL_TABLET | Freq: Every day | ORAL | 2 refills | Status: DC

## 2022-09-25 NOTE — Progress Notes (Signed)
BH MD/PA/NP OP Progress Note  09/25/2022 8:19 AM Jonathon Murphy  MRN:  [id number]  Visit Diagnosis:    ICD-10-CM   1. Seasonal affective disorder ([place])  F33.8     2. GAD (generalized anxiety disorder)  F41.1 atomoxetine (STRATTERA) 40 MG capsule    escitalopram (LEXAPRO) 10 MG tablet    3. OSA (obstructive sleep apnea)  G47.33        Assessment: Jonathon Murphy is a 37 y.o. y.o. male with a history of OSA, seasonal affective disorder, and reported ADHD who presented to [place] at [facility] for initial evaluation on 06/18/22.    During initial evaluation patient reported symptoms of anxiety including excessive worry that is difficult to control, social anxiety, feeling easily fatigued, difficulty concentrating, mild irritability, and some sleep disturbance.  He also endorsed difficulties with procrastination, completing work on time, and organizing tasks.  Of note patient has a history of obstructive sleep apnea that was recently diagnosed and is currently untreated.  Patient met criteria for generalized anxiety disorder and seasonal affective disorder.  He also had some traits consistent with ADHD though it is difficult to differentiate from the anxiety and sleep apnea.    Jonathon Murphy presents for follow-up evaluation. Today, 09/25/22, patient reports that his anxiety has continued to improve over the past couple months and he was even able to go on vacation, leaving his work behind, and relaxed during the trip.  Overall patient does continue to endorse some low level anxiety throughout his day to day however he believes this is within normal limits and manageable.  In regards to his attention and focus symptoms he feels the Jonathon Murphy is having a positive effect without any significant negatives.  Patient would like to continue on his current regimen and will follow-up in 4 to 6 weeks.  Plan: - Continue Lexapro 10 mg  - Continue Strattera 40 mg QD - Discontinued  Wellbutrin XL due to lack of benefit - CBC, CMP, lipid profile, TSH, A1c reviewed - Sleep study reviewed - Neuropsych testing referral - Therapy options provided, he is looking into it - Follow up in a month   Chief Complaint:  Chief Complaint  Patient presents with   Anxiety   Follow-up   Medication Refill   HPI: Jonathon Murphy presents reporting that things have gone fairly well over the past 2 months.  He recently just got back from vacation in [place] and is very proud of how he handled his anxiety during the trip.  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  Typically on vacation he brings his work things and checks on multiple times a day.  Despite not having it however he was able to relax and enjoy the vacation with his wife.  While there patient did note he went to a social function with some friends at a casino and gambled within moderation.  He and his wife had discussed this prior in such terms which Jonathon Murphy was able to follow without spending all his money or going to excess.  Since being back he denies having impulsive thoughts of going back to gambling.  Patient is proud of himself for handling the situation well.  Upon returning patient did find out he had diabetes and has been in the process of cutting sugar out of his diet as he was drinking 6 sodas a day.  He does note some increased irritability  however believes this is due to the change in diet.  He denies significantly reducing his caffeine intake during this time.  In regards to his anxiety he reports that there is some day to day but is manageable.  For the focus he has noticed some positive Strattera and like to continue on his current dose due to concern about potential side effects of increasing.  Also discussed potentially increasing Lexapro however patient declined and requested send current dose.  Past Psychiatric History: Has tried Ritalin,  Adderall, Wellbutrin in the past. Adderall xr 20 mg BID was what he felt that he stabilized on in the past. Therapy on and off since sixth grade.  Most recently was during Jonathon Murphy when he connected with a therapist for his addiction to gambling.  Patient also has been attending support groups and denies gambling in the past couple years.  He denies any suicide attempts or psychiatric hospitalizations.  Denies any substance use other than a cigar every few months and alcohol a few times a year.  Past Medical History:  Past Medical History:  Diagnosis Date   Allergy    Anxiety    Asthma    Concussion    Depression    Migraines    Prediabetes    Scarring of lung     Past Surgical History:  Procedure Laterality Date   ADENOIDECTOMY     APPENDECTOMY     LAPAROSCOPIC APPENDECTOMY N/A 06/05/2017   Procedure: APPENDECTOMY LAPAROSCOPIC;  Surgeon: Greer Pickerel, MD;  Location: [facility];  Service: General;  Laterality: N/A;   TONSILLECTOMY      Family Psychiatric History: Sister and maternal grandmother depression  Family History:  Family History  Problem Relation Age of Onset   Arthritis Mother    Miscarriages / [place] Mother    Sleep apnea Mother    Arthritis Father    Diabetes Father    Heart disease Father    Sleep apnea Father    Arthritis Sister    Depression Sister    Arthritis Maternal Grandmother    Diabetes Maternal Grandmother    Cancer Maternal Grandfather    Skin cancer Maternal Grandfather    Heart disease Paternal Grandmother    Cancer Paternal Grandfather    Drug abuse Paternal Grandfather    Diabetes Paternal Grandfather     Social History:  Social History   Socioeconomic History   Marital status: Married    Spouse name: Not on file   Number of children: 1   Years of education: Not on file   Highest education level: Bachelor's degree (e.g., BA, AB, BS)  Occupational History   Not on file  Tobacco Use   Smoking status: Never   Smokeless tobacco:  Never  Vaping Use   Vaping Use: Never used  Substance and Sexual Activity   Alcohol use: Yes    Comment: 1 drink every 2 weeks if on the golf [place]   Drug use: Never   Sexual activity: Not on file  Other Topics Concern   Not on file  Social History Narrative   Lives at home with wife and daughter   Right handed   Caffeine: 4 sodas per day, each 16 oz    Social Determinants of Health   Financial Resource Strain: Not on file  Food Insecurity: Not on file  Transportation Needs: Not on file  Physical Activity: Not on file  Stress: Not on file  Social Connections: Not on file    Allergies:  Allergies  Allergen Reactions   Bee Venom Anaphylaxis   Sulfa Antibiotics Shortness Of Breath   Azithromycin Other (See Comments)    unknown    Benadryl [Diphenhydramine] Hives   Erythromycin Other (See Comments)    Does not know   Other Itching, Swelling and Other (See Comments)    Anything in the "Nut family" Mouth swelling   Suprax [Cefixime] Other (See Comments)    Does not know     Amoxicillin-Pot Clavulanate Rash   Latex Rash   Tape Rash    Paper tape is ok    Current Medications: Current Outpatient Medications  Medication Sig Dispense Refill   acetaminophen (TYLENOL) 500 MG tablet Take 500 mg by mouth every 6 (six) hours as needed.     albuterol (VENTOLIN HFA) 108 (90 Base) MCG/ACT inhaler Inhale 2 puffs into the lungs every 6 (six) hours as needed for wheezing or shortness of breath (cough, shortness of breath or wheezing.). 1 each 0   atomoxetine (STRATTERA) 40 MG capsule Take 1 capsule (40 mg total) by mouth daily. 30 capsule 2   EPINEPHrine 0.3 mg/0.3 mL IJ SOAJ injection Inject 0.3 mg into the muscle as needed. 2 each 0   escitalopram (LEXAPRO) 10 MG tablet Take 1 tablet (10 mg total) by mouth daily. 30 tablet 2   fluticasone-salmeterol (ADVAIR) 100-50 MCG/ACT AEPB Inhale 1 puff into the lungs 2 (two) times daily. 1 each 5   ibuprofen (ADVIL) 800 MG tablet Take 1  tablet (800 mg total) by mouth every 8 (eight) hours as needed. 30 tablet 2   metFORMIN (GLUCOPHAGE) 1000 MG tablet Take 1 tablet (1,000 mg total) by mouth daily with breakfast. 90 tablet 1   promethazine (PHENERGAN) 25 MG tablet Take 1 tablet (25 mg total) by mouth every 6 (six) hours as needed for nausea or vomiting. 30 tablet 1   No current facility-administered medications for this visit.    Psychiatric Specialty Exam: Review of Systems  There were no vitals taken for this visit.There is no height or weight on file to calculate BMI.  General Appearance: Well Groomed  Eye Contact:  Good  Speech:  Clear and Coherent and Normal Rate  Volume:  Normal  Mood:  Anxious and Euthymic  Affect:  Congruent  Thought Process:  Coherent, Goal Directed, and Linear  Orientation:  Full (Time, Place, and Person)  Thought Content: Logical   Suicidal Thoughts:  No  Homicidal Thoughts:  No  Memory:  Immediate;   Good  Judgement:  Good  Insight:  Fair  Psychomotor Activity:  Normal  Concentration:  Concentration: Good  Recall:  Good  Fund of Knowledge: Fair  Language: Good  Akathisia:  NA    AIMS (if indicated): not done  Assets:  Communication Skills Desire for Improvement Financial Resources/Insurance Housing Intimacy Physical Health Talents/Skills Transportation Vocational/Educational  ADL's:  Intact  Cognition: WNL  Sleep:  Fair   Metabolic Disorder Labs: Lab Results  Component Value Date   HGBA1C 7.3 (H) 09/04/2022   No results found for: "PROLACTIN" Lab Results  Component Value Date   CHOL 168 09/04/2022   TRIG 109.0 09/04/2022   HDL 37.00 (L) 09/04/2022   CHOLHDL 5 09/04/2022   VLDL 21.8 09/04/2022   LDLCALC 109 (H) 09/04/2022   LDLCALC 121 (H) 08/29/2021   Lab Results  Component Value Date   TSH 2.49 09/04/2022   TSH 3.19 08/29/2021    Therapeutic Level Labs: No results found for: "LITHIUM" No results found for: "VALPROATE" No  results found for:  "CBMZ"   Screenings: [place] Office Visit from 06/18/2022 in [facility] Visit from 05/19/2019 in [place]  Total GAD-7 Score 12 7      PHQ2-9    [place] Visit from 09/04/2022 in [place] Visit from 06/18/2022 in [facility] Office Visit from 05/14/2022 in [place] Visit from 08/16/2021 in [place] Visit from 05/19/2019 in [place]  PHQ-2 Total Score 0 2 0 5 3  PHQ-9 Total Score -- 13 -- 17 [place] Visit from 06/18/2022 in [facility]  C-SSRS RISK CATEGORY No Risk       Collaboration of Care: Collaboration of Care: Medication Management AEB medication prescription  Patient/Guardian was advised Release of Information must be obtained prior to any record release in order to collaborate their care with an outside provider. Patient/Guardian was advised if they have not already done so to contact the registration department to sign all necessary forms in order for [place] to release information regarding their care.   Consent: Patient/Guardian gives verbal consent for treatment and assignment of benefits for services provided during this visit. Patient/Guardian expressed understanding and agreed to proceed.    Location: Patient: Home Provider: Home Office   I discussed the limitations of evaluation and management by telemedicine and the availability of in person appointments. The patient expressed understanding and agreed to proceed.   I discussed the assessment and treatment plan with the patient. The patient was provided an opportunity to ask questions and all were answered. The patient agreed with the plan and demonstrated an understanding of the instructions.    The patient was advised to call back or seek an in-person evaluation if the symptoms worsen or if the condition fails to improve as anticipated.  I provided 20 minutes of non-face-to-face time during this encounter.   Vista Mink, MD 09/25/2022, 8:19 AM

## 2022-10-01 ENCOUNTER — Ambulatory Visit (HOSPITAL_COMMUNITY): Payer: BC Managed Care – PPO | Admitting: Psychiatry

## 2022-10-22 NOTE — Progress Notes (Unsigned)
BH MD/PA/NP OP Progress Note  10/23/2022 9:05 AM Jonathon Murphy  MRN:  [id number]  Visit Diagnosis:    ICD-10-CM   1. GAD (generalized anxiety disorder)  F41.1     2. Seasonal affective disorder ([place])  F33.8     3. OSA (obstructive sleep apnea)  G47.33        Assessment: Jonathon Murphy is a 37 y.o. male with a history of OSA, seasonal affective disorder, and reported ADHD who presented to [place] at [facility] for initial evaluation on 06/18/22.    During initial evaluation patient reported symptoms of anxiety including excessive worry that is difficult to control, social anxiety, feeling easily fatigued, difficulty concentrating, mild irritability, and some sleep disturbance.  He also endorsed difficulties with procrastination, completing work on time, and organizing tasks.  Of note patient has a history of obstructive sleep apnea that was recently diagnosed and is currently untreated.  Patient met criteria for generalized anxiety disorder and seasonal affective disorder.  He also had some traits consistent with ADHD though it is difficult to differentiate from the anxiety and sleep apnea.    Jonathon Murphy presents for follow-up evaluation. Today, 10/23/22, patient reports that he has had an increase in anxiety over the past month secondary to situational stressors.  His wife is pregnant with her second child and she is excited for however it also comes with increased worry about logistics and financial issues.  With this and did reports racing thoughts and is interested in increasing his Lexapro.  We discussed an increase to 15 mg and went over the risk and benefits.  Patient continues to take Strattera with good benefit and denies any notable side effects or increased anxiety on the medication.  Plan: - Increase Lexapro to 15 mg  - Continue Strattera 40 mg QD - Discontinued Wellbutrin XL due to lack of benefit - CBC, CMP, lipid profile, TSH, A1c reviewed -  Sleep study reviewed - Neuropsych testing referral - Therapy options provided, he is looking into it - Follow up in a month   Chief Complaint:  Chief Complaint  Patient presents with   Follow-up   HPI: Job presents reporting that things have been hectic over the past month. He found out his wife is pregnant which he is excited for her, but it happened quicker then he expected which has increased his anxiety.  Jonathon Murphy reports that he has felt himself worrying about how they are going to get ready, finances, and other concerns related to this pretty much nonstop since finding out about the pregnancy.  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  While he does not mind this it has left him feeling more fatigued during the days and less time for hobbies things that he used to do to unwind.  On top of all this he notes that the area administrator showed up and now is at work which increases stress there too.  We discussed the stressors and some ways to help manage it.  We also went over his medications and the possibility of increasing the Lexapro.  [name] was interested in increasing Lexapro and denies any adverse side effects at this time.  We will increase Lexapro to 15 mg daily and went over the risk and benefits.  Past Psychiatric History: Has tried Ritalin, Adderall, Wellbutrin in the past. Adderall xr 20 mg BID was  what he felt that he stabilized on in the past. Therapy on and off since sixth grade.  Most recently was during [place] when he connected with a therapist for his addiction to gambling.  Patient also has been attending support groups and denies gambling in the past couple years.  He denies any suicide attempts or psychiatric hospitalizations.  Denies any substance use other than a cigar every few months and alcohol a few times a year.  Past Medical History:  Past Medical  History:  Diagnosis Date   Allergy    Anxiety    Asthma    Concussion    Depression    Migraines    Prediabetes    Scarring of lung     Past Surgical History:  Procedure Laterality Date   ADENOIDECTOMY     APPENDECTOMY     LAPAROSCOPIC APPENDECTOMY N/A 06/05/2017   Procedure: APPENDECTOMY LAPAROSCOPIC;  Surgeon: Greer Pickerel, MD;  Location: [facility];  Service: General;  Laterality: N/A;   TONSILLECTOMY      Family Psychiatric History: Sister and maternal grandmother depression  Family History:  Family History  Problem Relation Age of Onset   Arthritis Mother    Miscarriages / [place] Mother    Sleep apnea Mother    Arthritis Father    Diabetes Father    Heart disease Father    Sleep apnea Father    Arthritis Sister    Depression Sister    Arthritis Maternal Grandmother    Diabetes Maternal Grandmother    Cancer Maternal Grandfather    Skin cancer Maternal Grandfather    Heart disease Paternal Grandmother    Cancer Paternal Grandfather    Drug abuse Paternal Grandfather    Diabetes Paternal Grandfather     Social History:  Social History   Socioeconomic History   Marital status: Married    Spouse name: Not on file   Number of children: 1   Years of education: Not on file   Highest education level: Bachelor's degree (e.g., BA, AB, BS)  Occupational History   Not on file  Tobacco Use   Smoking status: Never   Smokeless tobacco: Never  Vaping Use   Vaping Use: Never used  Substance and Sexual Activity   Alcohol use: Yes    Comment: 1 drink every 2 weeks if on the golf [place]   Drug use: Never   Sexual activity: Not on file  Other Topics Concern   Not on file  Social History Narrative   Lives at home with wife and daughter   Right handed   Caffeine: 4 sodas per day, each 16 oz    Social Determinants of Health   Financial Resource Strain: Not on file  Food Insecurity: Not on file  Transportation Needs: Not on file  Physical Activity: Not on  file  Stress: Not on file  Social Connections: Not on file    Allergies:  Allergies  Allergen Reactions   Bee Venom Anaphylaxis   Sulfa Antibiotics Shortness Of Breath   Azithromycin Other (See Comments)    unknown    Benadryl [Diphenhydramine] Hives   Erythromycin Other (See Comments)    Does not know   Other Itching, Swelling and Other (See Comments)    Anything in the "Nut family" Mouth swelling   Suprax [Cefixime] Other (See Comments)    Does not know     Amoxicillin-Pot Clavulanate Rash   Latex Rash   Tape Rash    Paper tape is ok  Current Medications: Current Outpatient Medications  Medication Sig Dispense Refill   acetaminophen (TYLENOL) 500 MG tablet Take 500 mg by mouth every 6 (six) hours as needed.     albuterol (VENTOLIN HFA) 108 (90 Base) MCG/ACT inhaler Inhale 2 puffs into the lungs every 6 (six) hours as needed for wheezing or shortness of breath (cough, shortness of breath or wheezing.). 1 each 0   atomoxetine (STRATTERA) 40 MG capsule Take 1 capsule (40 mg total) by mouth daily. 30 capsule 2   EPINEPHrine 0.3 mg/0.3 mL IJ SOAJ injection Inject 0.3 mg into the muscle as needed. 2 each 0   escitalopram (LEXAPRO) 10 MG tablet Take 1 tablet (10 mg total) by mouth daily. 30 tablet 2   fluticasone-salmeterol (ADVAIR) 100-50 MCG/ACT AEPB Inhale 1 puff into the lungs 2 (two) times daily. 1 each 5   ibuprofen (ADVIL) 800 MG tablet Take 1 tablet (800 mg total) by mouth every 8 (eight) hours as needed. 30 tablet 2   metFORMIN (GLUCOPHAGE) 1000 MG tablet Take 1 tablet (1,000 mg total) by mouth daily with breakfast. 90 tablet 1   promethazine (PHENERGAN) 25 MG tablet Take 1 tablet (25 mg total) by mouth every 6 (six) hours as needed for nausea or vomiting. 30 tablet 1   No current facility-administered medications for this visit.    Psychiatric Specialty Exam: Review of Systems  There were no vitals taken for this visit.There is no height or weight on file to  calculate BMI.  General Appearance: Well Groomed  Eye Contact:  Good  Speech:  Clear and Coherent and Normal Rate  Volume:  Normal  Mood:  Anxious  Affect:  Congruent  Thought Process:  Coherent, Goal Directed, and Linear  Orientation:  Full (Time, Place, and Person)  Thought Content: Logical   Suicidal Thoughts:  No  Homicidal Thoughts:  No  Memory:  Immediate;   Good  Judgement:  Good  Insight:  Fair  Psychomotor Activity:  Normal  Concentration:  Concentration: Good  Recall:  Good  Fund of Knowledge: Fair  Language: Good  Akathisia:  NA    AIMS (if indicated): not done  Assets:  Communication Skills Desire for Improvement Financial Resources/Insurance Housing Intimacy Physical Health Talents/Skills Transportation Vocational/Educational  ADL's:  Intact  Cognition: WNL  Sleep:  Fair   Metabolic Disorder Labs: Lab Results  Component Value Date   HGBA1C 7.3 (H) 09/04/2022   No results found for: "PROLACTIN" Lab Results  Component Value Date   CHOL 168 09/04/2022   TRIG 109.0 09/04/2022   HDL 37.00 (L) 09/04/2022   CHOLHDL 5 09/04/2022   VLDL 21.8 09/04/2022   LDLCALC 109 (H) 09/04/2022   LDLCALC 121 (H) 08/29/2021   Lab Results  Component Value Date   TSH 2.49 09/04/2022   TSH 3.19 08/29/2021    Therapeutic Level Labs: No results found for: "LITHIUM" No results found for: "VALPROATE" No results found for: "CBMZ"   Screenings: GAD-7    Flowsheet [place] Office Visit from 06/18/2022 in [facility] Office Visit from 05/19/2019 in [place]  Total GAD-7 Score 12 7      PHQ2-9    [place] Visit from 09/04/2022 in [place] Visit from 06/18/2022 in [facility] Visit from [facility]14/2022 [facility] Visit from 08/16/2021 in [facility]  from 05/19/2019 in [place]  PHQ-2 Total Score 0  2 0 5 3  PHQ-9 Total Score -- 13 -- 17 6      Flowsheet Row Office Visit from 06/18/2022 in [place] No Risk       Collaboration of Care: Collaboration of Care: Medication Management AEB medication prescription  Patient/Guardian was advised Release of Information must be obtained prior to any record release in order to collaborate their care with an outside provider. Patient/Guardian was advised if they have not already done so to contact the registration department to sign all necessary forms in order for [place] to release information regarding their care.   Consent: Patient/Guardian gives verbal consent for treatment and assignment of benefits for services provided during this visit. Patient/Guardian expressed understanding and agreed to proceed.    Location: Patient: work Provider: Biomedical scientist   I discussed the limitations of evaluation and management by telemedicine and the availability of in person appointments. The patient expressed understanding and agreed to proceed.   I discussed the assessment and treatment plan with the patient. The patient was provided an opportunity to ask questions and all were answered. The patient agreed with the plan and demonstrated an understanding of the instructions.   The patient was advised to call back or seek an in-person evaluation if the symptoms worsen or if the condition fails to improve as anticipated.  I provided 15 minutes of non-face-to-face time during this encounter.   Vista Mink, MD 10/23/2022, 9:05 AM

## 2022-10-23 ENCOUNTER — Telehealth (HOSPITAL_BASED_OUTPATIENT_CLINIC_OR_DEPARTMENT_OTHER): Payer: BC Managed Care – PPO | Admitting: Psychiatry

## 2022-10-23 ENCOUNTER — Encounter (HOSPITAL_COMMUNITY): Payer: Self-pay | Admitting: Psychiatry

## 2022-10-23 DIAGNOSIS — F411 Generalized anxiety disorder: Secondary | ICD-10-CM

## 2022-10-23 DIAGNOSIS — F338 Other recurrent depressive disorders: Secondary | ICD-10-CM | POA: Diagnosis not present

## 2022-10-23 DIAGNOSIS — G4733 Obstructive sleep apnea (adult) (pediatric): Secondary | ICD-10-CM

## 2022-10-23 MED ORDER — ESCITALOPRAM OXALATE 10 MG PO TABS: 15.0000 mg | ORAL_TABLET | Freq: Every day | ORAL | 2 refills | Status: DC

## 2022-11-27 ENCOUNTER — Telehealth (HOSPITAL_BASED_OUTPATIENT_CLINIC_OR_DEPARTMENT_OTHER): Payer: BC Managed Care – PPO | Admitting: Psychiatry

## 2022-11-27 ENCOUNTER — Encounter (HOSPITAL_COMMUNITY): Payer: Self-pay | Admitting: Psychiatry

## 2022-11-27 ENCOUNTER — Telehealth (HOSPITAL_COMMUNITY): Payer: BC Managed Care – PPO | Admitting: Psychiatry

## 2022-11-27 DIAGNOSIS — F338 Other recurrent depressive disorders: Secondary | ICD-10-CM

## 2022-11-27 DIAGNOSIS — F411 Generalized anxiety disorder: Secondary | ICD-10-CM | POA: Diagnosis not present

## 2022-11-27 DIAGNOSIS — G4733 Obstructive sleep apnea (adult) (pediatric): Secondary | ICD-10-CM

## 2022-11-27 MED ORDER — ATOMOXETINE HCL 40 MG PO CAPS: 40.0000 mg | ORAL_CAPSULE | Freq: Every day | ORAL | 2 refills | Status: DC

## 2022-11-27 MED ORDER — ESCITALOPRAM OXALATE 10 MG PO TABS: 15.0000 mg | ORAL_TABLET | Freq: Every day | ORAL | 1 refills | Status: DC

## 2022-11-27 NOTE — Progress Notes (Signed)
BH MD/PA/NP OP Progress Note  11/27/2022 9:45 AM Jonathon Murphy  MRN:  [id number]  Visit Diagnosis:    ICD-10-CM   1. GAD (generalized anxiety disorder)  F41.1     2. Seasonal affective disorder  F33.8     3. OSA (obstructive sleep apnea)  G47.33        Assessment: Jonathon Murphy is a 37 y.o. male with a history of OSA, seasonal affective disorder, and reported ADHD who presented to [place] [facility] for initial evaluation on 06/18/22.    During initial evaluation patient reported symptoms of anxiety including excessive worry that is difficult to control, social anxiety, feeling easily fatigued, difficulty concentrating, mild irritability, and some sleep disturbance.  He also endorsed difficulties with procrastination, completing work on time, and organizing tasks.  Of note patient has a history of obstructive sleep apnea that was recently diagnosed and is currently untreated.  Patient met criteria for generalized anxiety disorder and seasonal affective disorder.  He also had some traits consistent with ADHD though it is difficult to differentiate from the anxiety and sleep apnea.    Jonathon Murphy presents for follow-up evaluation. Today, 11/27/22, patient reports an improvement in his anxiety symptoms over the past month. He has tolerated the Lexapro increase well with the only notable side effect being decreased libido which he finds tolerable. Patient continues to take Strattera with good benefit and denies any notable side effects or increased anxiety on the medication.  Plan: - Continue Lexapro to 15 mg  - Continue Strattera 40 mg QD - CBC, CMP, lipid profile, TSH, A1c reviewed - Sleep study reviewed - Neuropsych testing referral, on  hold now as he is doing well - Therapy options provided, he is looking into it - Follow up in 2 months   Chief Complaint:  Chief Complaint  Patient presents with   Follow-up   HPI: Jonathon Murphy presents reporting that the  last couple months have seemed to go pretty well for him. He believes that the increase in Lexapro has been helpful in controlling his anxiety symptoms as evidenced by his decreased picking behaviors, decline in racing thoughts, and improved mood stability. He has also noticed that he is not spending as much time looking at screens each day, which has helped to improve his overall mood. He is unsure if that is a benefit from the Strattera or Lexapro, but regardless feels that both medications seem to be working well. The only adverse side effect he has noticed is decreased Libido after increasing Lexapro. For now it is not bothering him, and patient finds this tolerable.   Past Psychiatric History: Has tried Ritalin, Adderall, Wellbutrin in the past. Adderall xr 20 mg BID was what he felt that he stabilized on in the past. Therapy on and off since sixth grade.  Most recently was during [place] when he connected with a therapist for his addiction to gambling.  Patient also has been attending support groups and denies gambling in the past couple years.  He denies any suicide attempts or psychiatric hospitalizations.  Denies any substance use other than a cigar every few months and alcohol a few times a year.  Past Medical History:  Past Medical History:  Diagnosis Date   Allergy    Anxiety    Asthma    Concussion    Depression    Migraines    Prediabetes    Scarring of lung     Past Surgical History:  Procedure Laterality Date  ADENOIDECTOMY     APPENDECTOMY     LAPAROSCOPIC APPENDECTOMY N/A 06/05/2017   Procedure: APPENDECTOMY LAPAROSCOPIC;  Surgeon: Greer Pickerel, MD;  Location: [facility];  Service: General;  Laterality: N/A;   TONSILLECTOMY      Family Psychiatric History: Sister and maternal grandmother depression  Family History:  Family History  Problem Relation Age of Onset   Arthritis Mother    Miscarriages / [place] Mother    Sleep apnea Mother    Arthritis Father     Diabetes Father    Heart disease Father    Sleep apnea Father    Arthritis Sister    Depression Sister    Arthritis Maternal Grandmother    Diabetes Maternal Grandmother    Cancer Maternal Grandfather    Skin cancer Maternal Grandfather    Heart disease Paternal Grandmother    Cancer Paternal Grandfather    Drug abuse Paternal Grandfather    Diabetes Paternal Grandfather     Social History:  Social History   Socioeconomic History   Marital status: Married    Spouse name: Not on file   Number of children: 1   Years of education: Not on file   Highest education level: Bachelor's degree (e.g., BA, AB, BS)  Occupational History   Not on file  Tobacco Use   Smoking status: Never   Smokeless tobacco: Never  Vaping Use   Vaping Use: Never used  Substance and Sexual Activity   Alcohol use: Yes    Comment: 1 drink every 2 weeks if on the golf course   Drug use: Never   Sexual activity: Not on file  Other Topics Concern   Not on file  Social History Narrative   Lives at home with wife and daughter   Right handed   Caffeine: 4 sodas per day, each 16 oz    Social Determinants of Health   Financial Resource Strain: Not on file  Food Insecurity: Not on file  Transportation Needs: Not on file  Physical Activity: Not on file  Stress: Not on file  Social Connections: Not on file    Allergies:  Allergies  Allergen Reactions   Bee Venom Anaphylaxis   Sulfa Antibiotics Shortness Of Breath   Azithromycin Other (See Comments)    unknown    Benadryl [Diphenhydramine] Hives   Erythromycin Other (See Comments)    Does not know   Other Itching, Swelling and Other (See Comments)    Anything in the "Nut family" Mouth swelling   Suprax [Cefixime] Other (See Comments)    Does not know     Amoxicillin-Pot Clavulanate Rash   Latex Rash   Tape Rash    Paper tape is ok    Current Medications: Current Outpatient Medications  Medication Sig Dispense Refill   acetaminophen  (TYLENOL) 500 MG tablet Take 500 mg by mouth every 6 (six) hours as needed.     albuterol (VENTOLIN HFA) 108 (90 Base) MCG/ACT inhaler Inhale 2 puffs into the lungs every 6 (six) hours as needed for wheezing or shortness of breath (cough, shortness of breath or wheezing.). 1 each 0   atomoxetine (STRATTERA) 40 MG capsule Take 1 capsule (40 mg total) by mouth daily. 30 capsule 2   EPINEPHrine 0.3 mg/0.3 mL IJ SOAJ injection Inject 0.3 mg into the muscle as needed. 2 each 0   escitalopram (LEXAPRO) 10 MG tablet Take 1.5 tablets (15 mg total) by mouth daily. 45 tablet 2   fluticasone-salmeterol (ADVAIR) 100-50 MCG/ACT AEPB Inhale  1 puff into the lungs 2 (two) times daily. 1 each 5   ibuprofen (ADVIL) 800 MG tablet Take 1 tablet (800 mg total) by mouth every 8 (eight) hours as needed. 30 tablet 2   metFORMIN (GLUCOPHAGE) 1000 MG tablet Take 1 tablet (1,000 mg total) by mouth daily with breakfast. 90 tablet 1   promethazine (PHENERGAN) 25 MG tablet Take 1 tablet (25 mg total) by mouth every 6 (six) hours as needed for nausea or vomiting. 30 tablet 1   No current facility-administered medications for this visit.    Psychiatric Specialty Exam: Review of Systems  There were no vitals taken for this visit.There is no height or weight on file to calculate BMI.  General Appearance: Well Groomed  Eye Contact:  Good  Speech:  Clear and Coherent and Normal Rate  Volume:  Normal  Mood:  Euthymic  Affect:  Congruent  Thought Process:  Coherent, Goal Directed, and Linear  Orientation:  Full (Time, Place, and Person)  Thought Content: Logical   Suicidal Thoughts:  No  Homicidal Thoughts:  No  Memory:  Immediate;   Good  Judgement:  Good  Insight:  Good  Psychomotor Activity:  Normal  Concentration:  Concentration: Good  Recall:  Good  Fund of Knowledge: Fair  Language: Good  Akathisia:  NA    AIMS (if indicated): not done  Assets:  Communication Skills Desire for Improvement Financial  Resources/Insurance Housing Intimacy Physical Health Talents/Skills Transportation Vocational/Educational  ADL's:  Intact  Cognition: WNL  Sleep:  Good   Metabolic Disorder Labs: Lab Results  Component Value Date   HGBA1C 7.3 (H) 09/04/2022   No results found for: "PROLACTIN" Lab Results  Component Value Date   CHOL 168 09/04/2022   TRIG 109.0 09/04/2022   HDL 37.00 (L) 09/04/2022   CHOLHDL 5 09/04/2022   VLDL 21.8 09/04/2022   LDLCALC 109 (H) 09/04/2022   LDLCALC 121 (H) 08/29/2021   Lab Results  Component Value Date   TSH 2.49 09/04/2022   TSH 3.19 08/29/2021    Therapeutic Level Labs: No results found for: "LITHIUM" No results found for: "VALPROATE" No results found for: "CBMZ"   Screenings: GAD-7    Flowsheet Row Office Visit from 06/18/2022 in [facility] Office Visit from 05/19/2019 in [place]  Total GAD-7 Score 12 7      PHQ2-9    [place] Visit from 09/04/2022 in [facility] Visit from 06/18/2022 in [facility] Office Visit from 05/14/2022 in [facility] from 08/16/2021 in [facility] from 05/19/2019 in [facility]  PHQ-2 Total Score 0 2 0 5 3  PHQ-9 Total Score -- 13 -- [place] Visit from 06/18/2022 in [facility] No Risk       Collaboration of Care: Collaboration of Care: Medication Management AEB medication prescription  Patient/Guardian was advised Release of Information must be obtained prior to any record release in order to collaborate their care with an outside provider. Patient/Guardian was advised if they have not already done so to contact the registration department to sign all necessary forms in order for [place] to  release information regarding their care.   Consent: Patient/Guardian gives verbal consent for treatment and assignment of benefits for services provided during this visit. Patient/Guardian expressed understanding and agreed to  proceed.    Virtual Visit via Video Note  I connected with Jonathon Murphy on 11/27/22 at 11:00 AM EDT by a video enabled telemedicine application and verified that I am speaking with the correct person using two identifiers.   Location: Patient: work Provider: Biomedical scientist   I discussed the limitations of evaluation and management by telemedicine and the availability of in person appointments. The patient expressed understanding and agreed to proceed.   I discussed the assessment and treatment plan with the patient. The patient was provided an opportunity to ask questions and all were answered. The patient agreed with the plan and demonstrated an understanding of the instructions.   The patient was advised to call back or seek an in-person evaluation if the symptoms worsen or if the condition fails to improve as anticipated.  I provided 15 minutes of non-face-to-face time during this encounter.   Vista Mink, MD 11/27/2022, 9:45 AM

## 2023-01-28 ENCOUNTER — Telehealth (HOSPITAL_COMMUNITY): Payer: BC Managed Care – PPO | Admitting: Psychiatry

## 2023-01-29 ENCOUNTER — Telehealth (HOSPITAL_COMMUNITY): Payer: BC Managed Care – PPO | Admitting: Psychiatry

## 2023-02-26 ENCOUNTER — Telehealth (HOSPITAL_BASED_OUTPATIENT_CLINIC_OR_DEPARTMENT_OTHER): Payer: BC Managed Care – PPO | Admitting: Psychiatry

## 2023-02-26 ENCOUNTER — Encounter (HOSPITAL_COMMUNITY): Payer: Self-pay | Admitting: Psychiatry

## 2023-02-26 DIAGNOSIS — F338 Other recurrent depressive disorders: Secondary | ICD-10-CM

## 2023-02-26 DIAGNOSIS — F411 Generalized anxiety disorder: Secondary | ICD-10-CM

## 2023-02-26 MED ORDER — ATOMOXETINE HCL 40 MG PO CAPS: 40.0000 mg | ORAL_CAPSULE | Freq: Every day | ORAL | 2 refills | Status: DC

## 2023-02-26 NOTE — Progress Notes (Signed)
BH MD/PA/NP OP Progress Note  02/26/2023 10:30 AM Jonathon Murphy  MRN:  [id number]  Visit Diagnosis:    ICD-10-CM   1. GAD (generalized anxiety disorder)  F41.1 atomoxetine (STRATTERA) 40 MG capsule    2. Seasonal affective disorder Jonathon Murphy)  F33.8         Assessment: Jonathon Murphy is a 37 y.o. male with a history of OSA, seasonal affective disorder, and reported ADHD who presented to [facility] for initial evaluation on 06/18/22.    During initial evaluation patient reported symptoms of anxiety including excessive worry that is difficult to control, social anxiety, feeling easily fatigued, difficulty concentrating, mild irritability, and some sleep disturbance.  He also endorsed difficulties with procrastination, completing work on time, and organizing tasks.  Of note patient has a history of obstructive sleep apnea that was recently diagnosed and is currently untreated.  Patient met criteria for generalized anxiety disorder and seasonal affective disorder.  He also had some traits consistent with ADHD though it is difficult to differentiate from the anxiety and sleep apnea.    Jonathon Murphy presents for follow-up evaluation. Today, 02/26/23, patient reports that his anxiety has increased over the last few weeks secondary to psychosocial stressors.  Prior to this patient had been doing well both in regards to anxiety and attention symptoms.  This increase in anxiety appears to be secondary to the stressors we recommended resuming therapy and can consider titrating Lexapro in the future if needed.  Patient was in agreement with this plan and will follow-up in 2 months.  Plan: - Continue Lexapro 15 mg, can increase in the future if anxiety has not improved - Continue Strattera 40 mg QD - CBC, CMP, lipid profile, TSH, A1c reviewed - Sleep study reviewed - Neuropsych testing referral, on  hold now as he is doing well - Will temporarily restart therapy with  Jonathon Murphy - Follow up in 2 months   Chief Complaint:  Chief Complaint  Patient presents with   Follow-up   HPI: Jonathon Murphy presents reporting he did end up going Casino in the interim when on a golf trip.  He was able to walk out as a casino though without losing money.  Patient has not gambled since, and denies the urge to do so.  He did talk to his wife about this prior, and talked with his therapist about returning if needed (was not necessary).  In addition to this there was also an unexpected trip to [place] for a funeral following his wife's grandmothers passing. He just got back after 2 weeks.  There had been a slight lead up to this with science that the grandmother was not doing well and Jonathon Murphy had some frustration with his wife for not deciding whether to go sooner.  Eventually he made the decision that they would go which was complicated by his wife deciding if they were going to bring the toddler and that his father-in-law decided to come.  Driving 19 hours to [place] was difficult especially since he does not get along well with his father-in-law and his father-in-law did not participate in the driving or caretaking of his toddler.  When he was with his wife's family he also felt uncomfortable since they spent most the time sitting around and he is used to being more active.  Upon returning patient has had 2 trying to catch up on all the work he missed over the past 2 weeks.  He had arranged coverage for court cases but  everything else is still left for him to do and he has an eval coming up which will show that he is behind.  Jonathon Murphy notes that while logically he understands that this will not affect him and that his supervisor will have his back, it still bothers him that this will be seen by other people.  1 other consequence of the unexpected trip is that his wife can no longer attend the vacation with his family which Jonathon Murphy is stressed about.  He has difficulty standing up to his parents which his wife  does not and the idea of being alone with them has him stressed.  With all of this going on Jonathon Murphy notes that his anxiety levels have increased and he has been picking more recently.  We reviewed the increase in his anxiety and discussed how the 3-week nature of it does appear to be secondary to the recent stressors.  That being the case while we could go for medication he might benefit more from therapy during this time.  Patient was open to retrying therapy and deciding on whether to further titrate the Lexapro in a couple months.  Past Psychiatric History: Has tried Ritalin, Adderall, Wellbutrin in the past. Adderall xr 20 mg BID was what he felt that he stabilized on in the past. Therapy on and off since sixth grade.  Most recently was during [place] when he connected with a therapist for his addiction to gambling.  Patient also has been attending support groups and denies gambling in the past couple years.  He denies any suicide attempts or psychiatric hospitalizations.  Denies any substance use other than a cigar every few months and alcohol a few times a year.  Past Medical History:  Past Medical History:  Diagnosis Date   Allergy    Anxiety    Asthma    Concussion    Depression    Migraines    Prediabetes    Scarring of lung     Past Surgical History:  Procedure Laterality Date   ADENOIDECTOMY     APPENDECTOMY     LAPAROSCOPIC APPENDECTOMY N/A 06/05/2017   Procedure: APPENDECTOMY LAPAROSCOPIC;  Surgeon: Gaynelle Adu, MD;  Location: [facility];  Service: General;  Laterality: N/A;   TONSILLECTOMY      Family Psychiatric History: Sister and maternal grandmother depression  Family History:  Family History  Problem Relation Age of Onset   Arthritis Mother    Miscarriages / [place] Mother    Sleep apnea Mother    Arthritis Father    Diabetes Father    Heart disease Father    Sleep apnea Father    Arthritis Sister    Depression Sister    Arthritis Maternal Grandmother     Diabetes Maternal Grandmother    Cancer Maternal Grandfather    Skin cancer Maternal Grandfather    Heart disease Paternal Grandmother    Cancer Paternal Grandfather    Drug abuse Paternal Grandfather    Diabetes Paternal Grandfather     Social History:  Social History   Socioeconomic History   Marital status: Married    Spouse name: Not on file   Number of children: 1   Years of education: Not on file   Highest education level: Bachelor's degree (e.g., BA, AB, BS)  Occupational History   Not on file  Tobacco Use   Smoking status: Never   Smokeless tobacco: Never  Vaping Use   Vaping Use: Never used  Substance and Sexual Activity  Alcohol use: Yes    Comment: 1 drink every 2 weeks if on the golf course   Drug use: Never   Sexual activity: Not on file  Other Topics Concern   Not on file  Social History Narrative   Lives at home with wife and daughter   Right handed   Caffeine: 4 sodas per day, each 16 oz    Social Determinants of Health   Financial Resource Strain: Not on file  Food Insecurity: Not on file  Transportation Needs: Not on file  Physical Activity: Not on file  Stress: Not on file  Social Connections: Not on file    Allergies:  Allergies  Allergen Reactions   Bee Venom Anaphylaxis   Sulfa Antibiotics Shortness Of Breath   Azithromycin Other (See Comments)    unknown    Benadryl [Diphenhydramine] Hives   Erythromycin Other (See Comments)    Does not know   Other Itching, Swelling and Other (See Comments)    Anything in the "Nut family" Mouth swelling   Suprax [Cefixime] Other (See Comments)    Does not know     Amoxicillin-Pot Clavulanate Rash   Latex Rash   Tape Rash    Paper tape is ok    Current Medications: Current Outpatient Medications  Medication Sig Dispense Refill   acetaminophen (TYLENOL) 500 MG tablet Take 500 mg by mouth every 6 (six) hours as needed.     albuterol (VENTOLIN HFA) 108 (90 Base) MCG/ACT inhaler Inhale 2  puffs into the lungs every 6 (six) hours as needed for wheezing or shortness of breath (cough, shortness of breath or wheezing.). 1 each 0   atomoxetine (STRATTERA) 40 MG capsule Take 1 capsule (40 mg total) by mouth daily. 30 capsule 2   EPINEPHrine 0.3 mg/0.3 mL IJ SOAJ injection Inject 0.3 mg into the muscle as needed. 2 each 0   escitalopram (LEXAPRO) 10 MG tablet Take 1.5 tablets (15 mg total) by mouth daily. 135 tablet 1   fluticasone-salmeterol (ADVAIR) 100-50 MCG/ACT AEPB Inhale 1 puff into the lungs 2 (two) times daily. 1 each 5   ibuprofen (ADVIL) 800 MG tablet Take 1 tablet (800 mg total) by mouth every 8 (eight) hours as needed. 30 tablet 2   metFORMIN (GLUCOPHAGE) 1000 MG tablet Take 1 tablet (1,000 mg total) by mouth daily with breakfast. 90 tablet 1   promethazine (PHENERGAN) 25 MG tablet Take 1 tablet (25 mg total) by mouth every 6 (six) hours as needed for nausea or vomiting. 30 tablet 1   No current facility-administered medications for this visit.    Psychiatric Specialty Exam: Review of Systems  There were no vitals taken for this visit.There is no height or weight on file to calculate BMI.  General Appearance: Well Groomed  Eye Contact:  Good  Speech:  Clear and Coherent and Normal Rate  Volume:  Normal  Mood:  Anxious and Euthymic  Affect:  Congruent  Thought Process:  Coherent, Goal Directed, and Linear  Orientation:  Full (Time, Place, and Person)  Thought Content: Logical and Rumination   Suicidal Thoughts:  No  Homicidal Thoughts:  No  Memory:  Immediate;   Good  Judgement:  Good  Insight:  Good  Psychomotor Activity:  Normal  Concentration:  Concentration: Good  Recall:  Good  Fund of Knowledge: Fair  Language: Good  Akathisia:  NA    AIMS (if indicated): not done  Assets:  Communication Skills Desire for Improvement Financial Resources/Insurance Housing Intimacy Physical  Health Talents/Skills Transportation Vocational/Educational  ADL's:   Intact  Cognition: WNL  Sleep:  Good   Metabolic Disorder Labs: Lab Results  Component Value Date   HGBA1C 7.3 (H) 09/04/2022   No results found for: "PROLACTIN" Lab Results  Component Value Date   CHOL 168 09/04/2022   TRIG 109.0 09/04/2022   HDL 37.00 (L) 09/04/2022   CHOLHDL 5 09/04/2022   VLDL 21.8 09/04/2022   LDLCALC 109 (H) 09/04/2022   LDLCALC 121 (H) 08/29/2021   Lab Results  Component Value Date   TSH 2.49 09/04/2022   TSH 3.19 08/29/2021    Therapeutic Level Labs: No results found for: "LITHIUM" No results found for: "VALPROATE" No results found for: "CBMZ"   Screenings: GAD-7    Flowsheet Row [facility] Office Visit from 05/19/2019 in [facility]  Total GAD-7 Score 12 7      PHQ2-9    Flowsheet Row Office Visit from 09/04/2022 in [facility] Office Visit from 06/18/2022 in [facility] Office Visit from 05/14/2022 in [facility] from 08/16/2021 in [facility] from 05/19/2019 in [facility]  PHQ-2 Total Score 0 2 0 5 3  PHQ-9 Total Score -- 13 -- 17 6      Flowsheet Row Office Visit [facility] 06/18/2022 [facility]  C-SSRS RISK CATEGORY No Risk       Collaboration of Care: Collaboration of Care: Medication Management AEB medication prescription  Patient/Guardian was advised Release of Information must be obtained prior to any record release in order to collaborate their care with an outside provider. Patient/Guardian was advised if they have not already done so to contact the registration department to sign all necessary forms in order for [place] to release information regarding their care.   Consent: Patient/Guardian gives verbal consent for treatment and assignment  of benefits for services provided during this visit. Patient/Guardian expressed understanding and agreed to proceed.    Virtual Visit via Video Note  I connected with Jonathon Murphy on 11/27/22 at 11:00 AM EDT by a video enabled telemedicine application and verified that I am speaking with the correct person using two identifiers.   Location: Patient: work Provider: Economist   I discussed the limitations of evaluation and management by telemedicine and the availability of in person appointments. The patient expressed understanding and agreed to proceed.   I discussed the assessment and treatment plan with the patient. The patient was provided an opportunity to ask questions and all were answered. The patient agreed with the plan and demonstrated an understanding of the instructions.   The patient was advised to call back or seek an in-person evaluation if the symptoms worsen or if the condition fails to improve as anticipated.  I provided 25 minutes of non-face-to-face time during this encounter.   Stasia Cavalier, MD 02/26/2023, 10:30 AM

## 2023-04-30 ENCOUNTER — Telehealth (HOSPITAL_COMMUNITY): Payer: BC Managed Care – PPO | Admitting: Psychiatry

## 2023-04-30 DIAGNOSIS — F411 Generalized anxiety disorder: Secondary | ICD-10-CM | POA: Diagnosis not present

## 2023-04-30 DIAGNOSIS — Z8659 Personal history of other mental and behavioral disorders: Secondary | ICD-10-CM | POA: Diagnosis not present

## 2023-04-30 DIAGNOSIS — F338 Other recurrent depressive disorders: Secondary | ICD-10-CM | POA: Diagnosis not present

## 2023-04-30 MED ORDER — ATOMOXETINE HCL 40 MG PO CAPS: 40.0000 mg | ORAL_CAPSULE | Freq: Every day | ORAL | 2 refills | Status: DC

## 2023-04-30 MED ORDER — ESCITALOPRAM OXALATE 10 MG PO TABS: 15.0000 mg | ORAL_TABLET | Freq: Every day | ORAL | 1 refills | Status: DC

## 2023-04-30 NOTE — Progress Notes (Signed)
BH MD/PA/NP OP Progress Note  05/01/2023 10:19 AM Jonathon Murphy  MRN:  [id number]  Visit Diagnosis:    ICD-10-CM   1. GAD (generalized anxiety disorder)  F41.1 escitalopram (LEXAPRO) 10 MG tablet    atomoxetine (STRATTERA) 40 MG capsule    2. Seasonal affective disorder (HCC)  F33.8 escitalopram (LEXAPRO) 10 MG tablet    3. History of ADHD  Z86.59 atomoxetine (STRATTERA) 40 MG capsule      Assessment: Jonathon Murphy is a 37 y.o. male with a history of OSA, seasonal affective disorder, and reported ADHD who presented to [facility] for initial evaluation on 06/18/22.    During initial evaluation patient reported symptoms of anxiety including excessive worry that is difficult to control, social anxiety, feeling easily fatigued, difficulty concentrating, mild irritability, and some sleep disturbance.  He also endorsed difficulties with procrastination, completing work on time, and organizing tasks.  Of note patient has a history of obstructive sleep apnea that was recently diagnosed and is currently untreated.  Patient met criteria for generalized anxiety disorder and seasonal affective disorder.  He also had some traits consistent with ADHD though it is difficult to differentiate from the anxiety and sleep apnea.    Jonathon Murphy presents for follow-up evaluation. Today, 05/01/23, patient reports mood and anxiety are both well controlled. He has been able to set boundaries in regards to the relationship with his mother which has significantly reduced his stress levels. He takes his medications consistently and denies any adverse side effects. Patient has also started the process of neuorpsych testing through [facility]. Will continue on his current regimen and follow up in 3 months.    Plan: - Continue Lexapro 15 mg - Continue Strattera 40 mg QD - CBC, CMP, lipid profile, TSH, A1c reviewed - Sleep study reviewed - Neuropsych testing referral, ongoing with  [facility] - Followed with Ladona Ridgel for several visits, stopped but can restart in the future if needed - Follow up in 3 months   Chief Complaint:  Chief Complaint  Patient presents with   Follow-up   HPI: Jonathon Murphy reports that he is doing well over the past couple months. He decided to make peace with how his mother acts and set boundaries with her. This occurred after a negative experience on the family vacation where he was left with all the responsibilities and found himself running around to complete all her tasks. The decision to set these boundaries has gone surprisingly well and not triggered the anxiety it had in the past. Things are also going well at home in regards to the upcoming birth of his second child. His wife has been scheduled for a c section in October and they have prepared a lot of the needed stuff for then. Patients mother is going to come stay with them for 9 months after the baby is born, but Jonathon Murphy feels that cutting that time short, if it becomes too much, will not be a problem.   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 The job is much closer to home which would be great. He has been working with is [employer]ch[employer], retail on how to prepare for the interview.   Jonathon Murphy notes that he has been working through ADHD screen process with [facility]. They talked about adding an IQ and autism screen. He has completed the first interview with the therapist for the initial evaluation. Then the 3 additional tests will occur  afterwards.   He continues to take his medications regularly and feels that they are working well. He denies any adverse side effects.  Past Psychiatric History: Has tried Ritalin, Adderall, Wellbutrin in the past. Adderall xr 20 mg BID was what he felt that he stabilized on in the past. Therapy on and off since sixth grade.  Most recently was during COVID when he connected with a therapist for his addiction to gambling.   Patient also has been attending support groups and denies gambling in the past couple years.  He denies any suicide attempts or psychiatric hospitalizations.  Denies any substance use other than a cigar every few months and alcohol a few times a year.  Past Medical History:  Past Medical History:  Diagnosis Date   Allergy    Anxiety    Asthma    Concussion    Depression    Migraines    Prediabetes    Scarring of lung     Past Surgical History:  Procedure Laterality Date   ADENOIDECTOMY     APPENDECTOMY     LAPAROSCOPIC APPENDECTOMY N/A 06/05/2017   Procedure: APPENDECTOMY LAPAROSCOPIC;  Surgeon: Gaynelle Adu, MD;  Location: [facility];  Service: General;  Laterality: N/A;   TONSILLECTOMY     Family History:  Family History  Problem Relation Age of Onset   Arthritis Mother    Miscarriages / [place] Mother    Sleep apnea Mother    Arthritis Father    Diabetes Father    Heart disease Father    Sleep apnea Father    Arthritis Sister    Depression Sister    Arthritis Maternal Grandmother    Diabetes Maternal Grandmother    Cancer Maternal Grandfather    Skin cancer Maternal Grandfather    Heart disease Paternal Grandmother    Cancer Paternal Grandfather    Drug abuse Paternal Grandfather    Diabetes Paternal Grandfather     Social History:  Social History   Socioeconomic History   Marital status: Married    Spouse name: Not on file   Number of children: 1   Years of education: Not on file   Highest education level: Bachelor's degree (e.g., BA, AB, BS)  Occupational History   Not on file  Tobacco Use   Smoking status: Never   Smokeless tobacco: Never  Vaping Use   Vaping status: Never Used  Substance and Sexual Activity   Alcohol use: Yes    Comment: 1 drink every 2 weeks if on the golf course   Drug use: Never   Sexual activity: Not on file  Other Topics Concern   Not on file  Social History Narrative   Lives at home with wife and daughter   Right  handed   Caffeine: 4 sodas per day, each 16 oz    Social Determinants of Health   Financial Resource Strain: Not on file  Food Insecurity: Not on file  Transportation Needs: Not on file  Physical Activity: Not on file  Stress: Not on file  Social Connections: Unknown (01/09/2022)   Received from [facility]   Social Network    Social Network: Not on file    Allergies:  Allergies  Allergen Reactions   Bee Venom Anaphylaxis   Sulfa Antibiotics Shortness Of Breath   Azithromycin Other (See Comments)    unknown    Benadryl [Diphenhydramine] Hives   Erythromycin Other (See Comments)    Does not know   Other Itching, Swelling and Other (  See Comments)    Anything in the "Nut family" Mouth swelling   Suprax [Cefixime] Other (See Comments)    Does not know     Amoxicillin-Pot Clavulanate Rash   Latex Rash   Tape Rash    Paper tape is ok    Current Medications: Current Outpatient Medications  Medication Sig Dispense Refill   acetaminophen (TYLENOL) 500 MG tablet Take 500 mg by mouth every 6 (six) hours as needed.     albuterol (VENTOLIN HFA) 108 (90 Base) MCG/ACT inhaler Inhale 2 puffs into the lungs every 6 (six) hours as needed for wheezing or shortness of breath (cough, shortness of breath or wheezing.). 1 each 0   atomoxetine (STRATTERA) 40 MG capsule Take 1 capsule (40 mg total) by mouth daily. 30 capsule 2   EPINEPHrine 0.3 mg/0.3 mL IJ SOAJ injection Inject 0.3 mg into the muscle as needed. 2 each 0   escitalopram (LEXAPRO) 10 MG tablet Take 1.5 tablets (15 mg total) by mouth daily. 135 tablet 1   fluticasone-salmeterol (ADVAIR) 100-50 MCG/ACT AEPB Inhale 1 puff into the lungs 2 (two) times daily. 1 each 5   ibuprofen (ADVIL) 800 MG tablet Take 1 tablet (800 mg total) by mouth every 8 (eight) hours as needed. 30 tablet 2   metFORMIN (GLUCOPHAGE) 1000 MG tablet Take 1 tablet (1,000 mg total) by mouth daily with breakfast. 90 tablet 1   promethazine (PHENERGAN) 25 MG  tablet Take 1 tablet (25 mg total) by mouth every 6 (six) hours as needed for nausea or vomiting. 30 tablet 1   No current facility-administered medications for this visit.     Psychiatric Specialty Exam: Review of Systems  There were no vitals taken for this visit.There is no height or weight on file to calculate BMI.  General Appearance: Well Groomed  Eye Contact:  Good  Speech:  Clear and Coherent and Normal Rate  Volume:  Normal  Mood:  Euthymic  Affect:  Appropriate and Congruent  Thought Process:  Coherent and Goal Directed  Orientation:  Full (Time, Place, and Person)  Thought Content: Logical   Suicidal Thoughts:  No  Homicidal Thoughts:  No  Memory:  Immediate;   Good  Judgement:  Good  Insight:  Good  Psychomotor Activity:  Normal  Concentration:  Concentration: Good  Recall:  Good  Fund of Knowledge: Good  Language: Good  Akathisia:  NA    AIMS (if indicated): not done  Assets:  Communication Skills Desire for Improvement Financial Resources/Insurance Housing Intimacy Physical Health Social Support Talents/Skills Transportation Vocational/Educational  ADL's:  Intact  Cognition: WNL  Sleep:  Good   Metabolic Disorder Labs: Lab Results  Component Value Date   HGBA1C 7.3 (H) 09/04/2022   No results found for: "PROLACTIN" Lab Results  Component Value Date   CHOL 168 09/04/2022   TRIG 109.0 09/04/2022   HDL 37.00 (L) 09/04/2022   CHOLHDL 5 09/04/2022   VLDL 21.8 09/04/2022   LDLCALC 109 (H) 09/04/2022   LDLCALC 121 (H) 08/29/2021   Lab Results  Component Value Date   TSH 2.49 09/04/2022   TSH 3.19 08/29/2021    Therapeutic Level Labs: No results found for: "LITHIUM" No results found for: "VALPROATE" No results found for: "CBMZ"   Screenings: GAD-7    Flowsheet Row [facility] Office Visit from 05/19/2019 in [facility]  Total GAD-7 Score 12  7      PHQ2-9  Flowsheet Row Office Visit from 09/04/2022 in [facility] from 06/18/2022 in [facility] Office Visit from 05/14/2022 in [facility] Visit from 08/16/2021 in [facility] from 05/19/2019 in [facility]  PHQ-2 Total Score 0 2 0 5 3  PHQ-9 Total Score -- 13 -- 17 6      Flowsheet Row Office Visit from 06/18/2022 in [facility]  C-SSRS RISK CATEGORY No Risk       Collaboration of Care: Collaboration of Care: Medication Management AEB medication prescription  Patient/Guardian was advised Release of Information must be obtained prior to any record release in order to collaborate their care with an outside provider. Patient/Guardian was advised if they have not already done so to contact the registration department to sign all necessary forms in order for [place] to release information regarding their care.   Consent: Patient/Guardian gives verbal consent for treatment and assignment of benefits for services provided during this visit. Patient/Guardian expressed understanding and agreed to proceed.    Stasia Cavalier, MD 05/01/2023, 10:19 AM   Virtual Visit via Video Note  I connected with Jonathon Murphy on 05/01/23 at  4:30 PM EDT by a video enabled telemedicine application and verified that I am speaking with the correct person using two identifiers.  Location: Patient: Home Provider: Home Office   I discussed the limitations of evaluation and management by telemedicine and the availability of in person appointments. The patient expressed understanding and agreed to proceed.   I discussed the assessment and treatment plan with the patient. The patient was provided an opportunity to ask questions and all were answered. The patient agreed with the plan and demonstrated an  understanding of the instructions.   The patient was advised to call back or seek an in-person evaluation if the symptoms worsen or if the condition fails to improve as anticipated.  I provided 25 minutes of non-face-to-face time during this encounter.   Stasia Cavalier, MD

## 2023-05-01 ENCOUNTER — Encounter (HOSPITAL_COMMUNITY): Payer: Self-pay | Admitting: Psychiatry

## 2023-07-29 NOTE — Progress Notes (Unsigned)
BH MD/PA/NP OP Progress Note  07/30/2023 3:20 PM Jonathon Murphy  MRN:  [id number]  Visit Diagnosis:    ICD-10-CM   1. GAD (generalized anxiety disorder)  F41.1 atomoxetine (STRATTERA) 40 MG capsule    2. Seasonal affective disorder (HCC)  F33.8     3. History of ADHD  Z86.59 atomoxetine (STRATTERA) 40 MG capsule     Assessment: Jonathon Murphy is a 37 y.o. male with a history of OSA, seasonal affective disorder, and reported ADHD who presented to [facility] at [facility] for initial evaluation on 06/18/22.    During initial evaluation patient reported symptoms of anxiety including excessive worry that is difficult to control, social anxiety, feeling easily fatigued, difficulty concentrating, mild irritability, and some sleep disturbance.  He also endorsed difficulties with procrastination, completing work on time, and organizing tasks.  Of note patient has a history of obstructive sleep apnea that was recently diagnosed and is currently untreated.  Patient met criteria for generalized anxiety disorder and seasonal affective disorder.  He also had some traits consistent with ADHD though it is difficult to differentiate from the anxiety and sleep apnea.    Jonathon Murphy presents for follow-up evaluation. Today, 07/30/23, patient reports struggling with increased fatigue though this is secondary to the birth of his son in October.  Mood and anxiety have been fairly stable.  Patient had discontinued the Lexapro and atomoxetine in part due to concern about sedation side effects and in part due to feeling overwhelmed.  He experienced some withdrawal with increased irritability after stopping the Lexapro.  Patient is interested in restarting the medications today.  We will restart Lexapro 10 mg for 3 days before increasing back to 15 mg daily.  Atomoxetine can be restarted at 40 mg daily.  Risk and benefits of this were reviewed.  Patient did complete neuropsych testing where he is  diagnosed with still a communication disorder and ADHD.  He is awaiting completion of the documentation at which point should be sent over by [facility].  Patient will connect with a therapist through [facility] and follow up with this provider in 3 months.  Plan: - Restart Lexapro for 10 mg daily for 3 days before increasing back to 15 mg daily - Continue Strattera 40 mg QD - CBC, CMP, lipid profile, TSH, A1c reviewed - Sleep study reviewed - Neuropsych testing completing, awaiting records from [facility] pt will submit when completed - Will be starting therapy through [facility] - Follow up in 3 months  Chief Complaint:  Chief Complaint  Patient presents with   Follow-up   HPI: Jonathon Murphy reports that he is tired, had another kid back in October and just started back at work last week.  He has had a lot going on with the birth of his son Jonathon Murphy back in October.  Patient notes that due to the lack of sleep he ended up discontinuing the Lexapro and the atomoxetine.  Jonathon Murphy had found that the Lexapro had caused some very mild sedation which typically was not an issue however with the lack of sleep related to the baby he was unable to tolerate it.  He expressed concern about falling asleep while it is his turn to watch the baby.  When he did discontinue the medication he had experienced some increase in irritability likely due to withdrawal.  Overall his anxiety and impulsiveness have been more or less stable whether this was due to no longer needing the medication or just lack of energy to do anything  impulsive the patient is unsure.  He is interested in restarting on the medications today.  We reviewed how to go about doing this and his patient has tolerated the atomoxetine well we agreed to restart at the 40 mg dose straight away.  As for the Lexapro it was recommended that he take the 10 mg dose for 3 days before increasing back to 15 mg daily.  Jonathon Murphy completed the neuropsych testing through [facility] where he was dx with  stoic communication disorder and ADHD. They plan to start him with a therapist there as well which he is open to. He thinks it will be especially helpful with his mother living with him.   Past Psychiatric History: Has tried Ritalin, Adderall, Wellbutrin in the past. Adderall xr 20 mg BID was what he felt that he stabilized on in the past. Therapy on and off since sixth grade.  Most recently was during COVID when he connected with a therapist for his addiction to gambling.  Patient also has been attending support groups and denies gambling in the past couple years.  He denies any suicide attempts or psychiatric hospitalizations.  Denies any substance use other than a cigar every few months and alcohol a few times a year.  Past Medical History:  Past Medical History:  Diagnosis Date   Allergy    Anxiety    Asthma    Concussion    Depression    Migraines    Prediabetes    Scarring of lung     Past Surgical History:  Procedure Laterality Date   ADENOIDECTOMY     APPENDECTOMY     LAPAROSCOPIC APPENDECTOMY N/A 06/05/2017   Procedure: APPENDECTOMY LAPAROSCOPIC;  Surgeon: Gaynelle Adu, MD;  Location: [facility];  Service: General;  Laterality: N/A;   TONSILLECTOMY     Family History:  Family History  Problem Relation Age of Onset   Arthritis Mother    Miscarriages / [place] Mother    Sleep apnea Mother    Arthritis Father    Diabetes Father    Heart disease Father    Sleep apnea Father    Arthritis Sister    Depression Sister    Arthritis Maternal Grandmother    Diabetes Maternal Grandmother    Cancer Maternal Grandfather    Skin cancer Maternal Grandfather    Heart disease Paternal Grandmother    Cancer Paternal Grandfather    Drug abuse Paternal Grandfather    Diabetes Paternal Grandfather     Social History:  Social History   Socioeconomic History   Marital status: Married    Spouse name: Not on file   Number of children: 1   Years of education: Not on file    Highest education level: Bachelor's degree (e.g., BA, AB, BS)  Occupational History   Not on file  Tobacco Use   Smoking status: Never   Smokeless tobacco: Never  Vaping Use   Vaping status: Never Used  Substance and Sexual Activity   Alcohol use: Yes    Comment: 1 drink every 2 weeks if on the golf course   Drug use: Never   Sexual activity: Not on file  Other Topics Concern   Not on file  Social History Narrative   Lives at home with wife and daughter   Right handed   Caffeine: 4 sodas per day, each 16 oz    Social Determinants of Health   Financial Resource Strain: Not on file  Food Insecurity: Not on file  Transportation Needs:  Not on file  Physical Activity: Not on file  Stress: Not on file  Social Connections: Unknown (01/09/2022)   Received from [facility]   Social Network    Social Network: Not on file    Allergies:  Allergies  Allergen Reactions   Bee Venom Anaphylaxis   Sulfa Antibiotics Shortness Of Breath   Azithromycin Other (See Comments)    unknown    Benadryl [Diphenhydramine] Hives   Erythromycin Other (See Comments)    Does not know   Other Itching, Swelling and Other (See Comments)    Anything in the "Nut family" Mouth swelling   Suprax [Cefixime] Other (See Comments)    Does not know     Amoxicillin-Pot Clavulanate Rash   Latex Rash   Tape Rash    Paper tape is ok    Current Medications: Current Outpatient Medications  Medication Sig Dispense Refill   acetaminophen (TYLENOL) 500 MG tablet Take 500 mg by mouth every 6 (six) hours as needed.     albuterol (VENTOLIN HFA) 108 (90 Base) MCG/ACT inhaler Inhale 2 puffs into the lungs every 6 (six) hours as needed for wheezing or shortness of breath (cough, shortness of breath or wheezing.). 1 each 0   atomoxetine (STRATTERA) 40 MG capsule Take 1 capsule (40 mg total) by mouth daily. 30 capsule 2   EPINEPHrine 0.3 mg/0.3 mL IJ SOAJ injection Inject 0.3 mg into the muscle as  needed. 2 each 0   escitalopram (LEXAPRO) 10 MG tablet Take 1.5 tablets (15 mg total) by mouth daily. 135 tablet 1   fluticasone-salmeterol (ADVAIR) 100-50 MCG/ACT AEPB Inhale 1 puff into the lungs 2 (two) times daily. 1 each 5   ibuprofen (ADVIL) 800 MG tablet Take 1 tablet (800 mg total) by mouth every 8 (eight) hours as needed. 30 tablet 2   metFORMIN (GLUCOPHAGE) 1000 MG tablet Take 1 tablet (1,000 mg total) by mouth daily with breakfast. 90 tablet 1   promethazine (PHENERGAN) 25 MG tablet Take 1 tablet (25 mg total) by mouth every 6 (six) hours as needed for nausea or vomiting. 30 tablet 1   No current facility-administered medications for this visit.     Psychiatric Specialty Exam: Review of Systems  There were no vitals taken for this visit.There is no height or weight on file to calculate BMI.  General Appearance: Well Groomed  Eye Contact:  Good  Speech:  Clear and Coherent and Normal Rate  Volume:  Normal  Mood:  Euthymic  Affect:  Appropriate and Congruent  Thought Process:  Coherent and Goal Directed  Orientation:  Full (Time, Place, and Person)  Thought Content: Logical   Suicidal Thoughts:  No  Homicidal Thoughts:  No  Memory:  Immediate;   Good  Judgement:  Good  Insight:  Good  Psychomotor Activity:  Normal  Concentration:  Concentration: Good  Recall:  Good  Fund of Knowledge: Good  Language: Good  Akathisia:  NA    AIMS (if indicated): not done  Assets:  Communication Skills Desire for Improvement Financial Resources/Insurance Housing Intimacy Physical Health Social Support Talents/Skills Transportation Vocational/Educational  ADL's:  Intact  Cognition: WNL  Sleep:  Good   Metabolic Disorder Labs: Lab Results  Component Value Date   HGBA1C 7.3 (H) 09/04/2022   No results found for: "PROLACTIN" Lab Results  Component Value Date   CHOL 168 09/04/2022   TRIG 109.0 09/04/2022   HDL 37.00 (L) 09/04/2022   CHOLHDL 5 09/04/2022   VLDL 21.8  09/04/2022   LDLCALC 109 (H) 09/04/2022   LDLCALC 121 (H) 08/29/2021   Lab Results  Component Value Date   TSH 2.49 09/04/2022   TSH 3.19 08/29/2021    Therapeutic Level Labs: No results found for: "LITHIUM" No results found for: "VALPROATE" No results found for: "CBMZ"   Screenings: GAD-7    Flowsheet Row Office [facility] Office Visit from 05/19/2019 in [facility] [place]  Total GAD-7 Score 12 7      PHQ2-9    Flowsheet Row Office Visit from 09/04/2022 in [facility] [place] Office Visit from 06[facility]18/2022 in [facility] Office Visit from 05[facility]14/2022 in [facility] [employer] Visit from 08/16/2021 in [facility] [employer] Visit from 05/19/2019 in [facility] [place]  PHQ-2 Total Score 0 2 0 5 3  PHQ-9 Total Score -- 13 -- 17 6      Flowsheet Row Office Visit from 06/18/2022 in [facility]  C-SSRS RISK CATEGORY No Risk       Collaboration of Care: Collaboration of Care: Medication Management AEB medication prescription  Patient/Guardian was advised Release of Information must be obtained prior to any record release in order to collaborate their care with an outside provider. Patient/Guardian was advised if they have not already done so to contact the registration department to sign all necessary forms in order for [place] to release information regarding their care.   Consent: Patient/Guardian gives verbal consent for treatment and assignment of benefits for services provided during this visit. Patient/Guardian expressed understanding and agreed to proceed.    Stasia Cavalier, MD 07/30/2023, 3:20 PM   Virtual Visit via Video Note  I connected with Jonathon Murphy on 07/30/23 at   3:00 PM EST by a video enabled telemedicine application and verified that I am speaking with the correct person using two identifiers.  Location: Patient: Home Provider: Home Office   I discussed the limitations of evaluation and management by telemedicine and the availability of in person appointments. The patient expressed understanding and agreed to proceed.   I discussed the assessment and treatment plan with the patient. The patient was provided an opportunity to ask questions and all were answered. The patient agreed with the plan and demonstrated an understanding of the instructions.   The patient was advised to call back or seek an in-person evaluation if the symptoms worsen or if the condition fails to improve as anticipated.  I provided 25 minutes of non-face-to-face time during this encounter.   Stasia Cavalier, MD

## 2023-07-30 ENCOUNTER — Encounter (HOSPITAL_COMMUNITY): Payer: Self-pay | Admitting: Psychiatry

## 2023-07-30 ENCOUNTER — Telehealth (HOSPITAL_BASED_OUTPATIENT_CLINIC_OR_DEPARTMENT_OTHER): Payer: BC Managed Care – PPO | Admitting: Psychiatry

## 2023-07-30 DIAGNOSIS — Z8659 Personal history of other mental and behavioral disorders: Secondary | ICD-10-CM | POA: Diagnosis not present

## 2023-07-30 DIAGNOSIS — F338 Other recurrent depressive disorders: Secondary | ICD-10-CM

## 2023-07-30 DIAGNOSIS — F411 Generalized anxiety disorder: Secondary | ICD-10-CM | POA: Diagnosis not present

## 2023-07-30 MED ORDER — ATOMOXETINE HCL 40 MG PO CAPS: 40.0000 mg | ORAL_CAPSULE | Freq: Every day | ORAL | 2 refills | Status: DC

## 2023-10-28 ENCOUNTER — Encounter: Payer: Self-pay | Admitting: Internal Medicine

## 2023-10-28 NOTE — Progress Notes (Deleted)
 BH MD/PA/NP OP Progress Note  10/28/2023 9:27 AM Jonathon Murphy  MRN:  [id number]  Visit Diagnosis:  No diagnosis found.  Assessment: Jonathon Murphy is a 38 y.o. male with a history of OSA, seasonal affective disorder, and reported ADHD who presented to [facility] for initial evaluation on 06/18/22.    During initial evaluation patient reported symptoms of anxiety including excessive worry that is difficult to control, social anxiety, feeling easily fatigued, difficulty concentrating, mild irritability, and some sleep disturbance.  He also endorsed difficulties with procrastination, completing work on time, and organizing tasks.  Of note patient has a history of obstructive sleep apnea that was recently diagnosed and is currently untreated.  Patient met criteria for generalized anxiety disorder and seasonal affective disorder.  He also had some traits consistent with ADHD though it is difficult to differentiate from the anxiety and sleep apnea.    Jonathon Murphy presents for follow-up evaluation. Today, 10/28/23, patient reports    struggling with increased fatigue though this is secondary to the birth of his son in October.  Mood and anxiety have been fairly stable.  Patient had discontinued the Lexapro and atomoxetine in part due to concern about sedation side effects and in part due to feeling overwhelmed.  He experienced some withdrawal with increased irritability after stopping the Lexapro.  Patient is interested in restarting the medications today.  We will restart Lexapro 10 mg for 3 days before increasing back to 15 mg daily.  Atomoxetine can be restarted at 40 mg daily.  Risk and benefits of this were reviewed.  Patient did complete neuropsych testing where he is diagnosed with still a communication disorder and ADHD.  He is awaiting completion of the documentation at which point should be sent over by [facility].  Patient will connect with a therapist through  [facility] and follow up with this provider in 3 months.  Plan: - Restart Lexapro for 10 mg daily for 3 days before increasing back to 15 mg daily - Continue Strattera 40 mg QD - CBC, CMP, lipid profile, TSH, A1c reviewed - Sleep study reviewed - Neuropsych testing completing, awaiting records from [facility] pt will submit when completed - Will be starting therapy through [facility] - Follow up in 3 months  Chief Complaint:  No chief complaint on file.  HPI: Jonathon Murphy reports that     he is tired, had another kid back in October and just started back at work last week.  He has had a lot going on with the birth of his son Jonathon Murphy back in October.  Patient notes that due to the lack of sleep he ended up discontinuing the Lexapro and the atomoxetine.  Jonathon Murphy had found that the Lexapro had caused some very mild sedation which typically was not an issue however with the lack of sleep related to the baby he was unable to tolerate it.  He expressed concern about falling asleep while it is his turn to watch the baby.  When he did discontinue the medication he had experienced some increase in irritability likely due to withdrawal.  Overall his anxiety and impulsiveness have been more or less stable whether this was due to no longer needing the medication or just lack of energy to do anything impulsive the patient is unsure.  He is interested in restarting on the medications today.  We reviewed how to go about doing this and his patient has tolerated the atomoxetine well we agreed to restart at the 40 mg  dose straight away.  As for the Lexapro it was recommended that he take the 10 mg dose for 3 days before increasing back to 15 mg daily.  Jonathon Murphy completed the neuropsych testing through [facility] where he was dx with stoic communication disorder and ADHD. They plan to start him with a therapist there as well which he is open to. He thinks it will be especially helpful with his mother living with him.   Past Psychiatric History:  Has tried Ritalin, Adderall, Wellbutrin in the past. Adderall xr 20 mg BID was what he felt that he stabilized on in the past. Therapy on and off since sixth grade.  Most recently was during COVID when he connected with a therapist for his addiction to gambling.  Patient also has been attending support groups and denies gambling in the past couple years.  He denies any suicide attempts or psychiatric hospitalizations.  Denies any substance use other than a cigar every few months and alcohol a few times a year.  Past Medical History:  Past Medical History:  Diagnosis Date   Allergy    Anxiety    Asthma    Concussion    Depression    Migraines    Prediabetes    Scarring of lung     Past Surgical History:  Procedure Laterality Date   ADENOIDECTOMY     APPENDECTOMY     LAPAROSCOPIC APPENDECTOMY N/A 06/05/2017   Procedure: APPENDECTOMY LAPAROSCOPIC;  Surgeon: Gaynelle Adu, MD;  Location: [facility];  Service: General;  Laterality: N/A;   TONSILLECTOMY     Family History:  Family History  Problem Relation Age of Onset   Arthritis Mother    Miscarriages / [place] Mother    Sleep apnea Mother    Arthritis Father    Diabetes Father    Heart disease Father    Sleep apnea Father    Arthritis Sister    Depression Sister    Arthritis Maternal Grandmother    Diabetes Maternal Grandmother    Cancer Maternal Grandfather    Skin cancer Maternal Grandfather    Heart disease Paternal Grandmother    Cancer Paternal Grandfather    Drug abuse Paternal Grandfather    Diabetes Paternal Grandfather     Social History:  Social History   Socioeconomic History   Marital status: Married    Spouse name: Not on file   Number of children: 1   Years of education: Not on file   Highest education level: Bachelor's degree (e.g., BA, AB, BS)  Occupational History   Not on file  Tobacco Use   Smoking status: Never   Smokeless tobacco: Never  Vaping Use   Vaping status: Never Used  Substance  and Sexual Activity   Alcohol use: Yes    Comment: 1 drink every 2 weeks if on the golf course   Drug use: Never   Sexual activity: Not on file  Other Topics Concern   Not on file  Social History Narrative   Lives at home with wife and daughter   Right handed   Caffeine: 4 sodas per day, each 16 oz    Social Drivers of Corporate investment banker Strain: Not on file  Food Insecurity: Not on file  Transportation Needs: Not on file  Physical Activity: Not on file  Stress: Not on file  Social Connections: Unknown (01/09/2022)   [facility]   Social Network    Social Network: Not on file  Allergies:  Allergies  Allergen Reactions   Bee Venom Anaphylaxis   Sulfa Antibiotics Shortness Of Breath   Azithromycin Other (See Comments)    unknown    Benadryl [Diphenhydramine] Hives   Erythromycin Other (See Comments)    Does not know   Other Itching, Swelling and Other (See Comments)    Anything in the "Nut family" Mouth swelling   Suprax [Cefixime] Other (See Comments)    Does not know     Amoxicillin-Pot Clavulanate Rash   Latex Rash   Tape Rash    Paper tape is ok    Current Medications: Current Outpatient Medications  Medication Sig Dispense Refill   acetaminophen (TYLENOL) 500 MG tablet Take 500 mg by mouth every 6 (six) hours as needed.     albuterol (VENTOLIN HFA) 108 (90 Base) MCG/ACT inhaler Inhale 2 puffs into the lungs every 6 (six) hours as needed for wheezing or shortness of breath (cough, shortness of breath or wheezing.). 1 each 0   atomoxetine (STRATTERA) 40 MG capsule Take 1 capsule (40 mg total) by mouth daily. 30 capsule 2   EPINEPHrine 0.3 mg/0.3 mL IJ SOAJ injection Inject 0.3 mg into the muscle as needed. 2 each 0   escitalopram (LEXAPRO) 10 MG tablet Take 1.5 tablets (15 mg total) by mouth daily. 135 tablet 1   fluticasone-salmeterol (ADVAIR) 100-50 MCG/ACT AEPB Inhale 1 puff into the lungs 2 (two) times daily. 1 each  5   ibuprofen (ADVIL) 800 MG tablet Take 1 tablet (800 mg total) by mouth every 8 (eight) hours as needed. 30 tablet 2   metFORMIN (GLUCOPHAGE) 1000 MG tablet Take 1 tablet (1,000 mg total) by mouth daily with breakfast. 90 tablet 1   promethazine (PHENERGAN) 25 MG tablet Take 1 tablet (25 mg total) by mouth every 6 (six) hours as needed for nausea or vomiting. 30 tablet 1   No current facility-administered medications for this visit.     Psychiatric Specialty Exam: Review of Systems  There were no vitals taken for this visit.There is no height or weight on file to calculate BMI.  General Appearance: Well Groomed  Eye Contact:  Good  Speech:  Clear and Coherent and Normal Rate  Volume:  Normal  Mood:  Euthymic  Affect:  Appropriate and Congruent  Thought Process:  Coherent and Goal Directed  Orientation:  Full (Time, Place, and Person)  Thought Content: Logical   Suicidal Thoughts:  No  Homicidal Thoughts:  No  Memory:  Immediate;   Good  Judgement:  Good  Insight:  Good  Psychomotor Activity:  Normal  Concentration:  Concentration: Good  Recall:  Good  Fund of Knowledge: Good  Language: Good  Akathisia:  NA    AIMS (if indicated): not done  Assets:  Communication Skills Desire for Improvement Financial Resources/Insurance Housing Intimacy Physical Health Social Support Talents/Skills Transportation Vocational/Educational  ADL's:  Intact  Cognition: WNL  Sleep:  Good   Metabolic Disorder Labs: Lab Results  Component Value Date   HGBA1C 7.3 (H) 09/04/2022   No results found for: "PROLACTIN" Lab Results  Component Value Date   CHOL 168 09/04/2022   TRIG 109.0 09/04/2022   HDL 37.00 (L) 09/04/2022   CHOLHDL 5 09/04/2022   VLDL 21.8 09/04/2022   LDLCALC 109 (H) 09/04/2022   LDLCALC 121 (H) 08/29/2021   Lab Results  Component Value Date   TSH 2.49 09/04/2022   TSH 3.19 08/29/2021    Therapeutic Level Labs: No results found for: "LITHIUM"  No results  found for: "VALPROATE" No results found for: "CBMZ"   Screenings: GAD-7    Flowsheet Row Office Visit [facility]/20[facility] in [facility] Office Visit from 05/19/2019 in [facility] [place]  Total GAD-7 Score 12 7      PHQ2-9    Flowsheet Row Office Visit from 09/04/2022 in [facility] [place] Office Visit from 06/18/2022 in [facility] Office Visit from 05[facility]14/2022 in [facility] [place] [employer] Visit from 08/16/2021 in [facility] [place] [employer] Visit from 05/19/2019 in [facility] [place]  PHQ-2 Total Score 0 2 0 5 3  PHQ-9 Total Score -- 13 -- 17 6      Flowsheet Row Office Visit from 06/18/2022 in [facility]  C-SSRS RISK CATEGORY No Risk       Collaboration of Care: Collaboration of Care: Medication Management AEB medication prescription  Patient/Guardian was advised Release of Information must be obtained prior to any record release in order to collaborate their care with an outside provider. Patient/Guardian was advised if they have not already done so to contact the registration department to sign all necessary forms in order for [place] to release information regarding their care.   Consent: Patient/Guardian gives verbal consent for treatment and assignment of benefits for services provided during this visit. Patient/Guardian expressed understanding and agreed to proceed.    Stasia Cavalier, MD 10/28/2023, 9:27 AM   Virtual Visit via Video Note  I connected with Jonathon Murphy on 10/28/23 at  3:00 PM EST by a video enabled telemedicine application and verified that I am speaking with the correct person using two identifiers.  Location: Patient: Home Provider: Home Office   I discussed the limitations of evaluation  and management by telemedicine and the availability of in person appointments. The patient expressed understanding and agreed to proceed.   I discussed the assessment and treatment plan with the patient. The patient was provided an opportunity to ask questions and all were answered. The patient agreed with the plan and demonstrated an understanding of the instructions.   The patient was advised to call back or seek an in-person evaluation if the symptoms worsen or if the condition fails to improve as anticipated.  I provided 25 minutes of non-face-to-face time during this encounter.   Stasia Cavalier, MD

## 2023-10-29 ENCOUNTER — Telehealth (HOSPITAL_COMMUNITY): Payer: BC Managed Care – PPO | Admitting: Psychiatry
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
## 2023-11-20 ENCOUNTER — Encounter: Payer: Self-pay | Admitting: Family

## 2023-11-20 ENCOUNTER — Ambulatory Visit: Payer: Self-pay | Admitting: *Deleted

## 2023-11-20 ENCOUNTER — Ambulatory Visit: Admitting: Family

## 2023-11-20 VITALS — BP 126/82 | HR 86 | Temp 97.8°F | Ht 70.0 in | Wt 236.4 lb

## 2023-11-20 DIAGNOSIS — K529 Noninfective gastroenteritis and colitis, unspecified: Secondary | ICD-10-CM | POA: Diagnosis not present

## 2023-11-20 MED ORDER — AZITHROMYCIN 500 MG PO TABS: ORAL_TABLET | ORAL | 0 refills | Status: DC

## 2023-11-20 MED ORDER — PROMETHAZINE HCL 12.5 MG PO TABS: 12.5000 mg | ORAL_TABLET | Freq: Three times a day (TID) | ORAL | 0 refills | Status: DC | PRN

## 2023-11-20 NOTE — Telephone Encounter (Signed)
 Message from [place] R sent at 11/20/2023  8:06 AM EDT  Copied From CRM [id number]. Reason for Triage: Diarrhea and vomiting with sulfur burps.    Call History  Contact Date/Time Type Contact Phone/Fax By  11/20/2023 08:02 AM EDT Phone (Incoming) Laurelyn Sickle, Charlann Lange, Judeth Cornfield   Reason for Disposition  [1] MODERATE diarrhea (e.g., 4-6 times / day more than normal) AND [2] present > 48 hours (2 days)  Answer Assessment - Initial Assessment Questions 1. DIARRHEA SEVERITY: "How bad is the diarrhea?" "How many more stools have you had in the past 24 hours than normal?"    - NO DIARRHEA (SCALE 0)   - MILD (SCALE 1-3): Few loose or mushy BMs; increase of 1-3 stools over normal daily number of stools; mild increase in ostomy output.   -  MODERATE (SCALE 4-7): Increase of 4-6 stools daily over normal; moderate increase in ostomy output.   -  SEVERE (SCALE 8-10; OR "WORST POSSIBLE"): Increase of 7 or more stools daily over normal; moderate increase in ostomy output; incontinence.     I am having diarrhea and vomiting 2. ONSET: "When did the diarrhea begin?"      Thur of last week I came down with vomiting and diarrhea every 30 min.   My duaghter had it earlier in the week.    I had this for 36 hours.  I was feeling better.  Sunday evening I started eating again.    Yesterday I got sulfur burps.   They got worse through the evening.   This morning I woke up with diarrhea and vomiting again.    The vomiting is better with the anacid.   It has mucus in it.   I had a fever over the weekend but not this time.   3. BM CONSISTENCY: "How loose or watery is the diarrhea?"      I'm nauseas now.   I'm feeling very bloated and full in my stomach.  4. VOMITING: "Are you also vomiting?" If Yes, ask: "How many times in the past 24 hours?"      Just nauseas this morning 5. ABDOMEN PAIN: "Are you having any abdomen pain?" If Yes, ask: "What does it feel like?" (e.g., crampy, dull, intermittent, constant)       I'm having cramping but no burning 6. ABDOMEN PAIN SEVERITY: If present, ask: "How bad is the pain?"  (e.g., Scale 1-10; mild, moderate, or severe)   - MILD (1-3): doesn't interfere with normal activities, abdomen soft and not tender to touch    - MODERATE (4-7): interferes with normal activities or awakens from sleep, abdomen tender to touch    - SEVERE (8-10): excruciating pain, doubled over, unable to do any normal activities       moderate 7. ORAL INTAKE: If vomiting, "Have you been able to drink liquids?" "How much liquids have you had in the past 24 hours?"     I'm keeping down ginger Al and water.      8. HYDRATION: "Any signs of dehydration?" (e.g., dry mouth [not just dry lips], too weak to stand, dizziness, new weight loss) "When did you last urinate?"     I'm trying to keep hydrated.   My urine is yellow.   Over the weekend it was dark.   9. EXPOSURE: "Have you traveled to a foreign country recently?" "Have you been exposed to anyone with diarrhea?" "Could you have eaten any food that was spoiled?"     Daughter was sick  with this last week. 10. ANTIBIOTIC USE: "Are you taking antibiotics now or have you taken antibiotics in the past 2 months?"       No     11. OTHER SYMPTOMS: "Do you have any other symptoms?" (e.g., fever, blood in stool)       See above 12. PREGNANCY: "Is there any chance you are pregnant?" "When was your last menstrual period?"       N/A  Protocols used: [facility]

## 2023-11-20 NOTE — Telephone Encounter (Signed)
  Chief Complaint: diarrhea and vomiting that started Thur. Of last week.   Was feeling better until Sunday evening he started having diarrhea and vomiting again.  Daughter was sick with same thing last week.    Symptoms: Diarrhea, nausea this morning had vomiting Tues. Again with sulfur burps.   Taking an antacid which is helping. Frequency: Since last Thur. Pertinent Negatives: Patient denies fever or vomiting this morning just nausea Disposition: [] ED /[] Urgent Care (no appt availability in office) / [x] Appointment(In office/virtual)/ []  [facility]/ [] Home Care/ [] Refused Recommended Disposition /[] [facility]/ []  Follow-up with PCP Additional Notes: Appt made for this afternoon at his request.

## 2023-11-20 NOTE — Telephone Encounter (Signed)
 Noted.

## 2023-11-20 NOTE — Progress Notes (Signed)
 Patient ID: Jonathon Murphy, male    DOB: 1985/11/08, 38 y.o.   MRN: [id number]  Chief Complaint  Patient presents with   Nausea    Pt c/o N/V and diarrhea, since last Thursday. Sx resolved. Pt states his urine was dark. Sx started again yesterday with N/V, diarrhea and indigestion. Has tried promethazine which did help vomiting but not nausea.        Discussed the use of AI scribe software for clinical note transcription with the patient, who gave verbal consent to proceed.  History of Present Illness Jonathon Murphy, a patient with an unknown past medical history, presents with a week-long history of gastrointestinal symptoms. The symptoms began with vomiting every thirty minutes for approximately 24 hours, followed by constipation and dark, low-volume urine. He attempted to manage his symptoms with over-the-counter promethazine and Imodium, but found little relief. By the third day, his symptoms began to improve slightly, and he was able to increase his fluid intake and eat solid food. However, by the fifth day, he began experiencing sulfur burps and indigestion, and by the sixth day, his vomiting returned along with very liquid diarrhea. He has had five episodes of diarrhea in the past 24 hours. He has continued to take promethazine for nausea, but has not taken any more Imodium. He also reports bloating and a sweet smell to his vomit and diarrhea. He mentions a recent exposure to a restaurant known to be infected with C. diff, although this was three weeks prior to the current symptoms.  Assessment & Plan Gastroenteritis - Acute gastroenteritis with recurrent vomiting, diarrhea, and abdominal bloating. Viral etiology likely due to recent exposure. Dehydration improved with fluids. Discussed medication options and dietary modifications. - Prescribe promethazine 12.5 mg for nausea. - Advise bland diet: white crackers, bananas, plain applesauce, toast. Avoid dairy, spicy, fried foods. - Encourage fluid  intake: water, ginger ale, ginger chews for nausea. - Recommend OTC Imodium just twice daily for diarrhea, caution against constipation. - Prescribe azithromycin if no improvement by tomorrow, 500mg  tablets twice daily for three days. - Suggest Tux pads for anal irritation.     Assessment & Plan:   Subjective:    Outpatient Medications Prior to Visit  Medication Sig Dispense Refill   acetaminophen (TYLENOL) 500 MG tablet Take 500 mg by mouth every 6 (six) hours as needed.     albuterol (VENTOLIN HFA) 108 (90 Base) MCG/ACT inhaler Inhale 2 puffs into the lungs every 6 (six) hours as needed for wheezing or shortness of breath (cough, shortness of breath or wheezing.). 1 each 0   atomoxetine (STRATTERA) 40 MG capsule Take 1 capsule (40 mg total) by mouth daily. 30 capsule 2   EPINEPHrine 0.3 mg/0.3 mL IJ SOAJ injection Inject 0.3 mg into the muscle as needed. 2 each 0   ibuprofen (ADVIL) 800 MG tablet Take 1 tablet (800 mg total) by mouth every 8 (eight) hours as needed. 30 tablet 2   metFORMIN (GLUCOPHAGE) 1000 MG tablet Take 1 tablet (1,000 mg total) by mouth daily with breakfast. 90 tablet 1   promethazine (PHENERGAN) 25 MG tablet Take 1 tablet (25 mg total) by mouth every 6 (six) hours as needed for nausea or vomiting. 30 tablet 1   escitalopram (LEXAPRO) 10 MG tablet Take 1.5 tablets (15 mg total) by mouth daily. 135 tablet 1   fluticasone-salmeterol (ADVAIR) 100-50 MCG/ACT AEPB Inhale 1 puff into the lungs 2 (two) times daily. (Patient not taking: Reported on 11/20/2023) 1 each 5  No facility-administered medications prior to visit.   Past Medical History:  Diagnosis Date   Allergy    Anxiety    Asthma    Concussion    Depression    Migraines    Prediabetes    Scarring of lung    Past Surgical History:  Procedure Laterality Date   ADENOIDECTOMY     APPENDECTOMY     LAPAROSCOPIC APPENDECTOMY N/A 06/05/2017   Procedure: APPENDECTOMY LAPAROSCOPIC;  Surgeon: Gaynelle Adu, MD;   Location: [facility];  Service: General;  Laterality: N/A;   TONSILLECTOMY     Allergies  Allergen Reactions   Bee Venom Anaphylaxis   Sulfa Antibiotics Shortness Of Breath   Azithromycin Other (See Comments)    unknown    Benadryl [Diphenhydramine] Hives   Erythromycin Other (See Comments)    Does not know   Other Itching, Swelling and Other (See Comments)    Anything in the "Nut family" Mouth swelling   Suprax [Cefixime] Other (See Comments)    Does not know     Amoxicillin-Pot Clavulanate Rash   Latex Rash   Tape Rash    Paper tape is ok      Objective:    Physical Exam Vitals and nursing note reviewed.  Constitutional:      General: He is not in acute distress.    Appearance: Normal appearance.  HENT:     Head: Normocephalic.  Cardiovascular:     Rate and Rhythm: Normal rate and regular rhythm.  Pulmonary:     Effort: Pulmonary effort is normal.     Breath sounds: Normal breath sounds.  Musculoskeletal:        General: Normal range of motion.     Cervical back: Normal range of motion.  Skin:    General: Skin is warm and dry.  Neurological:     Mental Status: He is alert and oriented to person, place, and time.  Psychiatric:        Mood and Affect: Mood normal.    BP 126/82 (BP Location: Left Arm, Patient Position: Sitting, Cuff Size: Large)   Pulse 86   Temp 97.8 F (36.6 C) (Temporal)   Ht 5\' 10"  (1.778 m)   Wt 236 lb 6.4 oz (107.2 kg)   SpO2 99%   BMI 33.92 kg/m  Wt Readings from Last 3 Encounters:  11/20/23 236 lb 6.4 oz (107.2 kg)  09/04/22 239 lb 12.8 oz (108.8 kg)  08/17/22 238 lb 9.6 oz (108.2 kg)      Jonathon Sellar, NP

## 2023-11-21 ENCOUNTER — Telehealth (HOSPITAL_BASED_OUTPATIENT_CLINIC_OR_DEPARTMENT_OTHER): Payer: Self-pay | Admitting: Psychiatry

## 2023-11-21 DIAGNOSIS — Z8659 Personal history of other mental and behavioral disorders: Secondary | ICD-10-CM | POA: Diagnosis not present

## 2023-11-21 DIAGNOSIS — F411 Generalized anxiety disorder: Secondary | ICD-10-CM

## 2023-11-21 DIAGNOSIS — F338 Other recurrent depressive disorders: Secondary | ICD-10-CM | POA: Diagnosis not present

## 2023-11-21 MED ORDER — ESCITALOPRAM OXALATE 10 MG PO TABS: 15.0000 mg | ORAL_TABLET | Freq: Every day | ORAL | 1 refills | Status: DC

## 2023-11-21 MED ORDER — ATOMOXETINE HCL 40 MG PO CAPS
40.0000 mg | ORAL_CAPSULE | Freq: Every day | ORAL | 2 refills | Status: DC
Start: 2023-11-21 — End: 2024-02-18

## 2023-11-22 ENCOUNTER — Encounter (HOSPITAL_COMMUNITY): Payer: Self-pay | Admitting: Psychiatry

## 2024-01-24 ENCOUNTER — Telehealth (HOSPITAL_COMMUNITY): Payer: Self-pay | Admitting: Psychiatry
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
## 2024-02-18 ENCOUNTER — Other Ambulatory Visit (HOSPITAL_COMMUNITY): Payer: Self-pay | Admitting: Psychiatry

## 2024-02-18 DIAGNOSIS — Z8659 Personal history of other mental and behavioral disorders: Secondary | ICD-10-CM

## 2024-02-18 DIAGNOSIS — F411 Generalized anxiety disorder: Secondary | ICD-10-CM

## 2024-02-18 MED ORDER — ATOMOXETINE HCL 40 MG PO CAPS
40.0000 mg | ORAL_CAPSULE | Freq: Every day | ORAL | 2 refills | Status: DC
Start: 2024-02-18 — End: 2024-04-09

## 2024-02-20 ENCOUNTER — Telehealth (HOSPITAL_COMMUNITY): Payer: Self-pay | Admitting: Psychiatry

## 2024-04-06 NOTE — Progress Notes (Signed)
 BH MD/PA/NP OP Progress Note  04/09/2024 9:27 AM Jonathon Murphy  MRN:  [id number]  Visit Diagnosis:    ICD-10-CM   1. GAD (generalized anxiety disorder)  F41.1 escitalopram  (LEXAPRO ) 20 MG tablet    atomoxetine  (STRATTERA ) 40 MG capsule    2. History of ADHD  Z86.59 atomoxetine  (STRATTERA ) 40 MG capsule    3. Seasonal affective disorder (HCC)  F33.8 escitalopram  (LEXAPRO ) 20 MG tablet     Assessment: Jonathon Murphy is a 38 y.o. male with a history of OSA, seasonal affective disorder, and reported ADHD who presented to [facility] for initial evaluation on 06/18/22.    During initial evaluation patient reported symptoms of anxiety including excessive worry that is difficult to control, social anxiety, feeling easily fatigued, difficulty concentrating, mild irritability, and some sleep disturbance.  He also endorsed difficulties with procrastination, completing work on time, and organizing tasks.  Of note patient has a history of obstructive sleep apnea that was recently diagnosed and is currently untreated.  Patient met criteria for generalized anxiety disorder and seasonal affective disorder.  He also had some traits consistent with ADHD though it is difficult to differentiate from the anxiety and sleep apnea.    Jonathon Murphy presents for follow-up evaluation. Today, 04/09/24, patient reports that his anxiety has increased in the interim with evidence of avoidance behaviors as well as nightmares multiple times a night.  Work stressors and world events seem to be the primary contributing factors for this.  We will titrate Lexapro  to 20 mg daily and reviewed the risk and benefits.  I discussed prazosin however patient opted to hold off given that he still has to wake up throughout the night to care for his son.  Patient did not connect with therapy in the interim though is open to referral at next visit if anxiety symptoms are still ongoing.  ADHD symptoms are  well controlled.  We will follow up in 2 months.  Psychotherapeutic interventions were used during today's session. From 9:06 AM to 9:26 AM. Therapeutic interventions included empathic listening, supportive therapy, cognitive and behavioral therapy, motivational interviewing. Used supportive interviewing techniques to provide emotional validation. Worked on cognitive reframing techniques and unhelpful thoughts challenged as appropriate. Alternative thoughts developed with guidance. Reviewed some techniques to facilitate increased behavioral activation. Improvement was evidenced by patient's participation and identified commitment to therapy goals.   Plan: - Increase Lexapro  to 20 mg daily - Continue Strattera  40 mg QD - CBC, CMP, lipid profile, TSH, A1c reviewed - Sleep study reviewed - Neuropsych testing completing, awaiting records from [facility] pt will submit when completed - Never started therapy with [facility], can provide therapy resources in future if patient still anxiety - Follow up in 2 months  Chief Complaint:  Chief Complaint  Patient presents with   Follow-up   HPI: Jonathon Murphy reports that the past 4.5 months that he is ok overall. He spent a week at the beach with his dad which ended up being really enjoyable. He had a good conversation with her mom the other day and the two ended up spending some quality time together which is not something they typically do.   He did get passed over for the promotion which was frustrating. He did get some good feedback, and believes it was only related to people with more seniority applying for the same job that were equally qualified.   His kids are doing well, and his wife is alright. She did have to restart with  her psychiatrist as she had a manic episode in the interim. Luckily they were able to catch it early and her behaviors were not overly destructive like they had been with manic episodes in the past.   He has been having some repetitive  nightmares lately about his children dying. The dream can occur 3-4 times in the same night. He is not sure if that is related to work stress or world events. He has been avoiding taking his kids out to really large crowds. Empathic listening techniques were used and support was provided. Discussed cognitive reframing techniques dress and restructure anxious thoughts around this.  A large piece of this is his work and one of the recent right events did involve somebody that he works with.  Helped patient to identify the positives and the differences in his/his families life and what they do compared to the people he works with.  His ADHD has been fairly stable. He is still able to focus when he needs. There is more to accomplish then he is ever able to do but thinks that is due to his workload and not due to any procrastination or inattention on his side.  Demonte was open to increasing the Lexapro . Did discuss prazosin, but some hesitancy about it as he has to still wake up through the night to take care of their son. Risks and benefits of the titration were discussed.  Past Psychiatric History: Has tried Ritalin, Adderall, Wellbutrin  in the past. Adderall xr 20 mg BID was what he felt that he stabilized on in the past. Therapy on and off since sixth grade.  Most recently was during COVID when he connected with a therapist for his addiction to gambling.  Patient also has been attending support groups and denies gambling in the past couple years.  He denies any suicide attempts or psychiatric hospitalizations.  Denies any substance use other than a cigar every few months and alcohol a few times a year.  Past Medical History:  Past Medical History:  Diagnosis Date   Allergy    Anxiety    Asthma    Concussion    Depression    Migraines    Prediabetes    Scarring of lung     Past Surgical History:  Procedure Laterality Date   ADENOIDECTOMY     APPENDECTOMY     LAPAROSCOPIC APPENDECTOMY N/A  06/05/2017   Procedure: APPENDECTOMY LAPAROSCOPIC;  Surgeon: Tanda Locus, MD;  Location: [facility];  Service: General;  Laterality: N/A;   TONSILLECTOMY     Family History:  Family History  Problem Relation Age of Onset   Arthritis Mother    Miscarriages / [place] Mother    Sleep apnea Mother    Arthritis Father    Diabetes Father    Heart disease Father    Sleep apnea Father    Arthritis Sister    Depression Sister    Arthritis Maternal Grandmother    Diabetes Maternal Grandmother    Cancer Maternal Grandfather    Skin cancer Maternal Grandfather    Heart disease Paternal Grandmother    Cancer Paternal Grandfather    Drug abuse Paternal Grandfather    Diabetes Paternal Grandfather     Social History:  Social History   Socioeconomic History   Marital status: Married    Spouse name: Not on file   Number of children: 1   Years of education: Not on file   Highest education level: Bachelor's degree (e.g., BA, AB, BS)  Occupational History   Not on file  Tobacco Use   Smoking status: Never   Smokeless tobacco: Never  Vaping Use   Vaping status: Never Used  Substance and Sexual Activity   Alcohol use: Yes    Comment: 1 drink every 2 weeks if on the golf course   Drug use: Never   Sexual activity: Not on file  Other Topics Concern   Not on file  Social History Narrative   Lives at home with wife and daughter   Right handed   Caffeine: 4 sodas per day, each 16 oz    Social Drivers of Corporate investment banker Strain: Not on file  Food Insecurity: Not on file  Transportation Needs: Not on file  Physical Activity: Not on file  Stress: Not on file  Social Connections: Unknown (01/09/2022)   [facility]   Social Network    Social Network: Not on file    Allergies:  Allergies  Allergen Reactions   Bee Venom Anaphylaxis   Sulfa Antibiotics Shortness Of Breath   Azithromycin  Other (See Comments)    unknown    Benadryl [Diphenhydramine]  Hives   Erythromycin Other (See Comments)    Does not know   Other Itching, Swelling and Other (See Comments)    Anything in the Nut family Mouth swelling   Suprax [Cefixime] Other (See Comments)    Does not know     Amoxicillin -Pot Clavulanate Rash   Latex Rash   Tape Rash    Paper tape is ok    Current Medications: Current Outpatient Medications  Medication Sig Dispense Refill   acetaminophen  (TYLENOL ) 500 MG tablet Take 500 mg by mouth every 6 (six) hours as needed.     albuterol  (VENTOLIN  HFA) 108 (90 Base) MCG/ACT inhaler Inhale 2 puffs into the lungs every 6 (six) hours as needed for wheezing or shortness of breath (cough, shortness of breath or wheezing.). 1 each 0   atomoxetine  (STRATTERA ) 40 MG capsule Take 1 capsule (40 mg total) by mouth daily. 30 capsule 2   azithromycin  (ZITHROMAX ) 500 MG tablet Take 1 tab daily. 3 tablet 0   EPINEPHrine  0.3 mg/0.3 mL IJ SOAJ injection Inject 0.3 mg into the muscle as needed. 2 each 0   escitalopram  (LEXAPRO ) 20 MG tablet Take 1 tablet (20 mg total) by mouth daily. 90 tablet 0   ibuprofen  (ADVIL ) 800 MG tablet Take 1 tablet (800 mg total) by mouth every 8 (eight) hours as needed. 30 tablet 2   metFORMIN  (GLUCOPHAGE ) 1000 MG tablet Take 1 tablet (1,000 mg total) by mouth daily with breakfast. 90 tablet 1   promethazine  (PHENERGAN ) 12.5 MG tablet Take 1 tablet (12.5 mg total) by mouth every 8 (eight) hours as needed for nausea or vomiting. 20 tablet 0   No current facility-administered medications for this visit.     Psychiatric Specialty Exam: Review of Systems  There were no vitals taken for this visit.There is no height or weight on file to calculate BMI.  General Appearance: Well Groomed  Eye Contact:  Good  Speech:  Clear and Coherent and Normal Rate  Volume:  Normal  Mood:  Euthymic  Affect:  Appropriate and Congruent  Thought Process:  Coherent and Goal Directed  Orientation:  Full (Time, Place, and Person)  Thought  Content: Logical   Suicidal Thoughts:  No  Homicidal Thoughts:  No  Memory:  Immediate;   Good  Judgement:  Good  Insight:  Good  Psychomotor Activity:  Normal  Concentration:  Concentration: Good  Recall:  Good  Fund of Knowledge: Good  Language: Good  Akathisia:  NA    AIMS (if indicated): not done  Assets:  Communication Skills Desire for Improvement Financial Resources/Insurance Housing Intimacy Physical Health Social Support Talents/Skills Transportation Vocational/Educational  ADL's:  Intact  Cognition: WNL  Sleep:  Good   Metabolic Disorder Labs: Lab Results  Component Value Date   HGBA1C 7.3 (H) 09/04/2022   No results found for: PROLACTIN Lab Results  Component Value Date   CHOL 168 09/04/2022   TRIG 109.0 09/04/2022   HDL 37.00 (L) 09/04/2022   CHOLHDL 5 09/04/2022   VLDL 21.8 09/04/2022   LDLCALC 109 (H) 09/04/2022   LDLCALC 121 (H) 08/29/2021   Lab Results  Component Value Date   TSH 2.49 09/04/2022   TSH 3.19 08/29/2021    Therapeutic Level Labs: No results found for: LITHIUM No results found for: VALPROATE No results found for: CBMZ   Screenings: GAD-7    Flowsheet Row [facility] Office Visit from 05/19/2019 in [facility] [place]  Total GAD-7 Score 12 7   PHQ2-9    Flowsheet Row Office Visit from 11/20/2023 in [facility] Visit from 09/04/2022 in [facility] [employer] [facility] Visit from 06/18/2022 in [facility] Office Visit from 05/14/2022 in [facility] [employer] Visit from 08/16/2021 in [facility] [employer] [facility] [place]  PHQ-2 Total Score 0 0 2 0 5  PHQ-9 Total Score -- -- 13 -- 17   Flowsheet Row Office Visit from 06/18/2022 in [facility]  C-SSRS RISK CATEGORY No Risk    Collaboration of Care: Collaboration of Care: Medication Management AEB medication prescription  Patient/Guardian was advised Release of Information must be obtained prior to any record release in order to collaborate their care with an outside provider. Patient/Guardian was advised if they have not already done so to contact the registration department to sign all necessary forms in order for us  to release information regarding their care.   Consent: Patient/Guardian gives verbal consent for treatment and assignment of benefits for services provided during this visit. Patient/Guardian expressed understanding and agreed to proceed.    Arvella CHRISTELLA Finder, MD 04/09/2024, 9:27 AM   Virtual Visit via Video Note  I connected with Jonathon Murphy on 04/09/24 at  9:00 AM EDT by a video enabled telemedicine application and verified that I am speaking with the correct person using two identifiers.  Location: Patient: Home Provider: Home Office   I discussed the limitations of evaluation and management by telemedicine and the availability of in person appointments. The patient expressed understanding and agreed to proceed.   I discussed the assessment and treatment plan with the patient. The patient was provided an opportunity to ask questions and all were answered. The patient agreed with the plan and demonstrated an understanding of the instructions.   The patient was advised to call back or seek an in-person evaluation if the symptoms worsen or if the condition fails to improve as anticipated.  I provided 25 minutes of non-face-to-face time during this encounter.   Arvella CHRISTELLA Finder, MD

## 2024-04-09 ENCOUNTER — Telehealth (HOSPITAL_COMMUNITY): Payer: Self-pay | Admitting: Psychiatry

## 2024-04-09 ENCOUNTER — Encounter (HOSPITAL_COMMUNITY): Payer: Self-pay | Admitting: Psychiatry

## 2024-04-09 DIAGNOSIS — F411 Generalized anxiety disorder: Secondary | ICD-10-CM

## 2024-04-09 DIAGNOSIS — F338 Other recurrent depressive disorders: Secondary | ICD-10-CM | POA: Diagnosis not present

## 2024-04-09 DIAGNOSIS — Z8659 Personal history of other mental and behavioral disorders: Secondary | ICD-10-CM

## 2024-04-09 MED ORDER — ATOMOXETINE HCL 40 MG PO CAPS: 40.0000 mg | ORAL_CAPSULE | Freq: Every day | ORAL | 2 refills | Status: DC

## 2024-04-09 MED ORDER — ESCITALOPRAM OXALATE 20 MG PO TABS: 20.0000 mg | ORAL_TABLET | Freq: Every day | ORAL | 0 refills | Status: DC

## 2024-04-09 NOTE — Patient Instructions (Signed)
To help

## 2024-06-08 NOTE — Progress Notes (Unsigned)
 BH MD/PA/NP OP Progress Note  06/11/2024 8:52 AM Jonathon Murphy  MRN:  [id number]  Visit Diagnosis:    ICD-10-CM   1. GAD (generalized anxiety disorder)  F41.1 escitalopram  (LEXAPRO ) 20 MG tablet    atomoxetine  (STRATTERA ) 40 MG capsule    2. Seasonal affective disorder  F33.8 escitalopram  (LEXAPRO ) 20 MG tablet    3. History of ADHD  Z86.59 atomoxetine  (STRATTERA ) 40 MG capsule      Assessment: Jonathon Murphy is a 38 y.o. male with a history of OSA, seasonal affective disorder, and reported ADHD who presented to [facility] for initial evaluation on 06/18/22.    During initial evaluation patient reported symptoms of anxiety including excessive worry that is difficult to control, social anxiety, feeling easily fatigued, difficulty concentrating, mild irritability, and some sleep disturbance.  He also endorsed difficulties with procrastination, completing work on time, and organizing tasks.  Of note patient has a history of obstructive sleep apnea that was recently diagnosed and is currently untreated.  Patient met criteria for generalized anxiety disorder and seasonal affective disorder.  He also had some traits consistent with ADHD though it is difficult to differentiate from the anxiety and sleep apnea.    Jonathon Murphy presents for follow-up evaluation. Today, 06/11/24, patient reports that anxiety and ADHD have both been stable and well-controlled.  Furthermore the previously endorsed nightmares have resolved in the interim.  He has had some difficulty with medication compliance missing 2 days a week on average.  He denies any adverse side effects or withdrawal effects noticed.  He is working on developing stable routine to improve compliance and patient's wife has also been assisting this.  We will continue on his current regimen and follow-up in 3 months.  Plan: - Increase Lexapro  to 20 mg daily - Continue Strattera  40 mg QD - CBC, CMP, lipid  profile, TSH, A1c reviewed - Sleep study reviewed - Neuropsych testing completing, awaiting records from [facility] pt will submit when completed - Never started therapy with [facility], can provide therapy resources in future if patient still anxiety - Follow up in 3 months  Chief Complaint:  Chief Complaint  Patient presents with   Follow-up   HPI: Jonathon Murphy reports that he is doing pretty good all thing considered. The trip to [place] went pretty well both work wise and gambling wise. He did not gamble 2 of the nights there and he did not spend more then his measured budget. While he did gamble again it did not feel like a relapse. He did not experience the dopamine rush from it that he had in the past. Furthermore he has had no thoughts of doing it again since he left.  He had some fear going into the trip that it would be destabilizing but has not experienced any of that upon returning home.  There had been some anxiety with the tournament, though it was more situational. He was able to reach out to his wife for support who talked him down from it.   Patient reports that the repeated nightmares he had mentioned previously improved in the interim.  He cannot recall the last time he had a nightmare.  His son just turned 1 last week, and things went well when celebrating with his parents last week. He had been able to handle the occasional negative comments without getting worked up or overly upset.  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  He has had  some difficulty with medication compliance.  It was worse last month though has been improving.  Patient misses the Lexapro  and atomoxetine  2 days a week on average.  He believes this is related to having a lot on his mind and being busy in the mornings.  His wife has been working to help improve his compliance.  Patient does have his medications in a pill container and next to his keys so he takes it before leaving  for work.  He denies any adverse side effects from the medications and believes they are working well.  Past Psychiatric History: Has tried Ritalin, Adderall, Wellbutrin  in the past. Adderall xr 20 mg BID was what he felt that he stabilized on in the past. Therapy on and off since sixth grade.  Most recently was during COVID when he connected with a therapist for his addiction to gambling.  Patient also has been attending support groups and denies gambling in the past couple years.  He denies any suicide attempts or psychiatric hospitalizations.  Denies any substance use other than a cigar every few months and alcohol a few times a year.  Past Medical History:  Past Medical History:  Diagnosis Date   Allergy    Anxiety    Asthma    Concussion    Depression    Migraines    Prediabetes    Scarring of lung     Past Surgical History:  Procedure Laterality Date   ADENOIDECTOMY     APPENDECTOMY     LAPAROSCOPIC APPENDECTOMY N/A 06/05/2017   Procedure: APPENDECTOMY LAPAROSCOPIC;  Surgeon: Tanda Locus, MD;  Location: [facility];  Service: General;  Laterality: N/A;   TONSILLECTOMY     Family History:  Family History  Problem Relation Age of Onset   Arthritis Mother    Miscarriages / [place] Mother    Sleep apnea Mother    Arthritis Father    Diabetes Father    Heart disease Father    Sleep apnea Father    Arthritis Sister    Depression Sister    Arthritis Maternal Grandmother    Diabetes Maternal Grandmother    Cancer Maternal Grandfather    Skin cancer Maternal Grandfather    Heart disease Paternal Grandmother    Cancer Paternal Grandfather    Drug abuse Paternal Grandfather    Diabetes Paternal Grandfather     Social History:  Social History   Socioeconomic History   Marital status: Married    Spouse name: Not on file   Number of children: 1   Years of education: Not on file   Highest education level: Bachelor's degree (e.g., BA, AB, BS)  Occupational History    Not on file  Tobacco Use   Smoking status: Never   Smokeless tobacco: Never  Vaping Use   Vaping status: Never Used  Substance and Sexual Activity   Alcohol use: Yes    Comment: 1 drink every 2 weeks if on the golf [place]   Drug use: Never   Sexual activity: Not on file  Other Topics Concern   Not on file  Social History Narrative   Lives at home with wife and daughter   Right handed   Caffeine: 4 sodas per day, each 16 oz    Social Drivers of Corporate investment banker Strain: Not on file  Food Insecurity: Not on file  Transportation Needs: Not on file  Physical Activity: Not on file  Stress: Not on file  Social Connections: Unknown (01/09/2022)  [facility]   Social Network    Social Network: Not on file    Allergies:  Allergies  Allergen Reactions   Bee Venom Anaphylaxis   Sulfa Antibiotics Shortness Of Breath   Azithromycin  Other (See Comments)    unknown    Benadryl [Diphenhydramine] Hives   Erythromycin Other (See Comments)    Does not know   Other Itching, Swelling and Other (See Comments)    Anything in the Nut family Mouth swelling   Suprax [Cefixime] Other (See Comments)    Does not know     Amoxicillin -Pot Clavulanate Rash   Latex Rash   Tape Rash    Paper tape is ok    Current Medications: Current Outpatient Medications  Medication Sig Dispense Refill   acetaminophen  (TYLENOL ) 500 MG tablet Take 500 mg by mouth every 6 (six) hours as needed.     albuterol  (VENTOLIN  HFA) 108 (90 Base) MCG/ACT inhaler Inhale 2 puffs into the lungs every 6 (six) hours as needed for wheezing or shortness of breath (cough, shortness of breath or wheezing.). 1 each 0   atomoxetine  (STRATTERA ) 40 MG capsule Take 1 capsule (40 mg total) by mouth daily. 30 capsule 2   azithromycin  (ZITHROMAX ) 500 MG tablet Take 1 tab daily. 3 tablet 0   EPINEPHrine  0.3 mg/0.3 mL IJ SOAJ injection Inject 0.3 mg into the muscle as needed. 2 each 0   escitalopram   (LEXAPRO ) 20 MG tablet Take 1 tablet (20 mg total) by mouth daily. 90 tablet 0   ibuprofen  (ADVIL ) 800 MG tablet Take 1 tablet (800 mg total) by mouth every 8 (eight) hours as needed. 30 tablet 2   metFORMIN  (GLUCOPHAGE ) 1000 MG tablet Take 1 tablet (1,000 mg total) by mouth daily with breakfast. 90 tablet 1   promethazine  (PHENERGAN ) 12.5 MG tablet Take 1 tablet (12.5 mg total) by mouth every 8 (eight) hours as needed for nausea or vomiting. 20 tablet 0   No current facility-administered medications for this visit.     Psychiatric Specialty Exam: Review of Systems  There were no vitals taken for this visit.There is no height or weight on file to calculate BMI.  General Appearance: Well Groomed  Eye Contact:  Good  Speech:  Clear and Coherent and Normal Rate  Volume:  Normal  Mood:  Euthymic  Affect:  Appropriate and Congruent  Thought Process:  Coherent and Goal Directed  Orientation:  Full (Time, Place, and Person)  Thought Content: Logical   Suicidal Thoughts:  No  Homicidal Thoughts:  No  Memory:  Immediate;   Good  Judgement:  Good  Insight:  Good  Psychomotor Activity:  Normal  Concentration:  Concentration: Good  Recall:  Good  Fund of Knowledge: Good  Language: Good  Akathisia:  NA    AIMS (if indicated): not done  Assets:  Communication Skills Desire for Improvement Financial Resources/Insurance Housing Intimacy Physical Health Social Support Talents/Skills Transportation Vocational/Educational  ADL's:  Intact  Cognition: WNL  Sleep:  Good   Metabolic Disorder Labs: Lab Results  Component Value Date   HGBA1C 7.3 (H) 09/04/2022   No results found for: PROLACTIN Lab Results  Component Value Date   CHOL 168 09/04/2022   TRIG 109.0 09/04/2022   HDL 37.00 (L) 09/04/2022   CHOLHDL 5 09/04/2022   VLDL 21.8 09/04/2022   LDLCALC 109 (H) 09/04/2022   LDLCALC 121 (H) 08/29/2021   Lab Results  Component Value Date   TSH 2.49 09/04/2022   TSH  3.19  08/29/2021    Therapeutic Level Labs: No results found for: LITHIUM No results found for: VALPROATE No results found for: CBMZ   Screenings: GAD-7    Flowsheet Row [facility] Office Visit from 05/19/2019 in [facility] [place]  Total GAD-7 Score 12 7   PHQ2-9    Flowsheet Row Office Visit from 11/20/2023 in [facility] [place] Office Visit from 09[facility]04/2022 in [facility] [place] Office Visit from 06/18/2022 in [facility] Office Visit from 05/14/2022 in [facility] [place] [employer] Visit from 08/16/2021 in [facility] [place]  PHQ-2 Total Score 0 0 2 0 5  PHQ-9 Total Score -- -- 13 -- 17   Flowsheet Row Office Visit from 06/18/2022 in [facility]  C-SSRS RISK CATEGORY No Risk    Collaboration of Care: Collaboration of Care: Medication Management AEB medication prescription  Patient/Guardian was advised Release of Information must be obtained prior to any record release in order to collaborate their care with an outside provider. Patient/Guardian was advised if they have not already done so to contact the registration department to sign all necessary forms in order for us  to release information regarding their care.   Consent: Patient/Guardian gives verbal consent for treatment and assignment of benefits for services provided during this visit. Patient/Guardian expressed understanding and agreed to proceed.    Arvella CHRISTELLA Finder, MD 06/11/2024, 8:52 AM   Virtual Visit via Video Note  I connected with Jonathon Murphy on 06/11/24 at  8:30 AM EDT by a video enabled telemedicine application and verified that I am speaking with the correct person using two identifiers.  Location: Patient:  Home Provider: Home Office   I discussed the limitations of evaluation and management by telemedicine and the availability of in person appointments. The patient expressed understanding and agreed to proceed.   I discussed the assessment and treatment plan with the patient. The patient was provided an opportunity to ask questions and all were answered. The patient agreed with the plan and demonstrated an understanding of the instructions.   The patient was advised to call back or seek an in-person evaluation if the symptoms worsen or if the condition fails to improve as anticipated.  I provided 25 minutes of non-face-to-face time during this encounter.   Arvella CHRISTELLA Finder, MD

## 2024-06-11 ENCOUNTER — Telehealth (HOSPITAL_BASED_OUTPATIENT_CLINIC_OR_DEPARTMENT_OTHER): Admitting: Psychiatry

## 2024-06-11 ENCOUNTER — Encounter (HOSPITAL_COMMUNITY): Payer: Self-pay | Admitting: Psychiatry

## 2024-06-11 DIAGNOSIS — F338 Other recurrent depressive disorders: Secondary | ICD-10-CM

## 2024-06-11 DIAGNOSIS — Z8659 Personal history of other mental and behavioral disorders: Secondary | ICD-10-CM

## 2024-06-11 DIAGNOSIS — F411 Generalized anxiety disorder: Secondary | ICD-10-CM | POA: Diagnosis not present

## 2024-06-11 MED ORDER — ESCITALOPRAM OXALATE 20 MG PO TABS: 20.0000 mg | ORAL_TABLET | Freq: Every day | ORAL | 0 refills | Status: DC

## 2024-06-11 MED ORDER — ATOMOXETINE HCL 40 MG PO CAPS: 40.0000 mg | ORAL_CAPSULE | Freq: Every day | ORAL | 2 refills | Status: DC
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
## 2024-09-10 ENCOUNTER — Telehealth (HOSPITAL_COMMUNITY): Admitting: Psychiatry

## 2024-09-10 ENCOUNTER — Encounter (HOSPITAL_COMMUNITY): Payer: Self-pay | Admitting: Psychiatry

## 2024-09-10 DIAGNOSIS — K529 Noninfective gastroenteritis and colitis, unspecified: Secondary | ICD-10-CM

## 2024-09-10 DIAGNOSIS — Z8659 Personal history of other mental and behavioral disorders: Secondary | ICD-10-CM | POA: Diagnosis not present

## 2024-09-10 DIAGNOSIS — F411 Generalized anxiety disorder: Secondary | ICD-10-CM | POA: Diagnosis not present

## 2024-09-10 DIAGNOSIS — F338 Other recurrent depressive disorders: Secondary | ICD-10-CM | POA: Diagnosis not present

## 2024-09-10 MED ORDER — PROMETHAZINE HCL 12.5 MG PO TABS: 12.5000 mg | ORAL_TABLET | Freq: Three times a day (TID) | ORAL | 1 refills | Status: AC | PRN

## 2024-09-10 MED ORDER — ATOMOXETINE HCL 40 MG PO CAPS: 40.0000 mg | ORAL_CAPSULE | Freq: Every day | ORAL | 2 refills | Status: AC

## 2024-09-10 MED ORDER — ESCITALOPRAM OXALATE 20 MG PO TABS: 20.0000 mg | ORAL_TABLET | Freq: Every day | ORAL | 0 refills | Status: AC

## 2024-12-07 ENCOUNTER — Telehealth (HOSPITAL_COMMUNITY): Admitting: Psychiatry
